# Patient Record
Sex: Female | Born: 1990 | Race: Black or African American | Hispanic: No | Smoking: Current every day smoker
Health system: Southern US, Community
[De-identification: ages and names within clinical notes are randomized; demographics above are authoritative.]

## PROBLEM LIST (undated history)

## (undated) ENCOUNTER — Inpatient Hospital Stay (HOSPITAL_COMMUNITY): Payer: Self-pay

## (undated) DIAGNOSIS — Z789 Other specified health status: Secondary | ICD-10-CM

## (undated) DIAGNOSIS — Z3480 Encounter for supervision of other normal pregnancy, unspecified trimester: Secondary | ICD-10-CM

## (undated) HISTORY — PX: NO PAST SURGERIES: SHX2092

## (undated) HISTORY — DX: Encounter for supervision of other normal pregnancy, unspecified trimester: Z34.80

## (undated) HISTORY — DX: Other specified health status: Z78.9

---
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
## 2009-03-12 ENCOUNTER — Emergency Department (HOSPITAL_COMMUNITY): Admission: EM | Admit: 2009-03-12 | Discharge: 2009-03-12 | Payer: Self-pay | Admitting: Emergency Medicine

## 2009-07-21 ENCOUNTER — Other Ambulatory Visit: Admission: RE | Admit: 2009-07-21 | Discharge: 2009-07-21 | Payer: Self-pay | Admitting: Obstetrics and Gynecology

## 2009-09-29 ENCOUNTER — Inpatient Hospital Stay (HOSPITAL_COMMUNITY): Admission: AD | Admit: 2009-09-29 | Discharge: 2009-09-29 | Payer: Self-pay | Admitting: Family Medicine

## 2009-11-21 ENCOUNTER — Inpatient Hospital Stay (HOSPITAL_COMMUNITY): Admission: AD | Admit: 2009-11-21 | Discharge: 2009-11-21 | Payer: Self-pay | Admitting: Obstetrics & Gynecology

## 2010-01-01 ENCOUNTER — Inpatient Hospital Stay (HOSPITAL_COMMUNITY): Admission: AD | Admit: 2010-01-01 | Discharge: 2010-01-02 | Payer: Self-pay | Admitting: Obstetrics & Gynecology

## 2010-01-28 ENCOUNTER — Inpatient Hospital Stay (HOSPITAL_COMMUNITY)
Admission: AD | Admit: 2010-01-28 | Discharge: 2010-01-28 | Payer: Self-pay | Source: Home / Self Care | Attending: Obstetrics and Gynecology | Admitting: Obstetrics and Gynecology

## 2010-02-03 ENCOUNTER — Inpatient Hospital Stay (HOSPITAL_COMMUNITY)
Admission: AD | Admit: 2010-02-03 | Discharge: 2010-02-03 | Payer: Self-pay | Source: Home / Self Care | Attending: Obstetrics & Gynecology | Admitting: Obstetrics & Gynecology

## 2010-02-16 ENCOUNTER — Encounter: Payer: Self-pay | Admitting: Obstetrics & Gynecology

## 2010-02-16 ENCOUNTER — Inpatient Hospital Stay (HOSPITAL_COMMUNITY)
Admission: AD | Admit: 2010-02-16 | Discharge: 2010-02-18 | Payer: Self-pay | Source: Home / Self Care | Attending: Obstetrics & Gynecology | Admitting: Obstetrics & Gynecology

## 2010-04-27 LAB — DIFFERENTIAL
Basophils Absolute: 0 10*3/uL (ref 0.0–0.1)
Basophils Relative: 0 % (ref 0–1)
Eosinophils Absolute: 0.1 10*3/uL (ref 0.0–0.7)
Eosinophils Relative: 0 % (ref 0–5)
Monocytes Absolute: 1.8 10*3/uL — ABNORMAL HIGH (ref 0.1–1.0)
Monocytes Relative: 7 % (ref 3–12)
Neutrophils Relative %: 83 % — ABNORMAL HIGH (ref 43–77)

## 2010-04-27 LAB — CBC
HCT: 28.7 % — ABNORMAL LOW (ref 36.0–46.0)
HCT: 33.6 % — ABNORMAL LOW (ref 36.0–46.0)
Hemoglobin: 10 g/dL — ABNORMAL LOW (ref 12.0–15.0)
Hemoglobin: 7.5 g/dL — ABNORMAL LOW (ref 12.0–15.0)
Hemoglobin: 7.5 g/dL — ABNORMAL LOW (ref 12.0–15.0)
MCH: 29 pg (ref 26.0–34.0)
MCH: 29.2 pg (ref 26.0–34.0)
MCH: 29.2 pg (ref 26.0–34.0)
MCHC: 33.6 g/dL (ref 30.0–36.0)
MCHC: 33.9 g/dL (ref 30.0–36.0)
MCHC: 34.8 g/dL (ref 30.0–36.0)
MCV: 83.7 fL (ref 78.0–100.0)
MCV: 86 fL (ref 78.0–100.0)
MCV: 86.2 fL (ref 78.0–100.0)
Platelets: 348 10*3/uL (ref 150–400)
Platelets: 357 10*3/uL (ref 150–400)
RBC: 2.57 MIL/uL — ABNORMAL LOW (ref 3.87–5.11)
RBC: 3.9 MIL/uL (ref 3.87–5.11)
RDW: 14.1 % (ref 11.5–15.5)
WBC: 12.9 10*3/uL — ABNORMAL HIGH (ref 4.0–10.5)
WBC: 29.6 10*3/uL — ABNORMAL HIGH (ref 4.0–10.5)

## 2010-04-27 LAB — DIC (DISSEMINATED INTRAVASCULAR COAGULATION)PANEL
Fibrinogen: 558 mg/dL — ABNORMAL HIGH (ref 204–475)
Prothrombin Time: 13.6 seconds (ref 11.6–15.2)
aPTT: 28 seconds (ref 24–37)

## 2010-04-27 LAB — ABO/RH: ABO/RH(D): O POS

## 2010-04-27 LAB — TYPE AND SCREEN
ABO/RH(D): O POS
Antibody Screen: NEGATIVE

## 2010-04-28 LAB — URINALYSIS, ROUTINE W REFLEX MICROSCOPIC
Hgb urine dipstick: NEGATIVE
Ketones, ur: NEGATIVE mg/dL
Nitrite: NEGATIVE
Protein, ur: NEGATIVE mg/dL
Specific Gravity, Urine: 1.015 (ref 1.005–1.030)
Urobilinogen, UA: 0.2 mg/dL (ref 0.0–1.0)
pH: 6.5 (ref 5.0–8.0)

## 2010-04-30 LAB — URINALYSIS, ROUTINE W REFLEX MICROSCOPIC
Hgb urine dipstick: NEGATIVE
Nitrite: NEGATIVE

## 2010-04-30 LAB — WET PREP, GENITAL: Trich, Wet Prep: NONE SEEN

## 2010-04-30 LAB — GC/CHLAMYDIA PROBE AMP, GENITAL: GC Probe Amp, Genital: NEGATIVE

## 2010-05-01 LAB — URINALYSIS, ROUTINE W REFLEX MICROSCOPIC
Bilirubin Urine: NEGATIVE
Glucose, UA: NEGATIVE mg/dL
Ketones, ur: >80 mg/dL — AB
Protein, ur: NEGATIVE mg/dL
Urobilinogen, UA: 0.2 mg/dL (ref 0.0–1.0)
pH: 8 (ref 5.0–8.0)

## 2010-05-01 LAB — DIFFERENTIAL
Eosinophils Absolute: 0.2 10*3/uL (ref 0.0–0.7)
Lymphs Abs: 1.8 10*3/uL (ref 0.7–4.0)
Monocytes Absolute: 0.6 10*3/uL (ref 0.1–1.0)
Neutrophils Relative %: 68 % (ref 43–77)

## 2010-05-01 LAB — COMPREHENSIVE METABOLIC PANEL
Calcium: 9.1 mg/dL (ref 8.4–10.5)
Chloride: 108 mEq/L (ref 96–112)
GFR calc Af Amer: >60 mL/min (ref >60)
GFR calc non Af Amer: >60 mL/min (ref >60)
Glucose, Bld: 72 mg/dL (ref 70–99)
Potassium: 3.5 mEq/L (ref 3.5–5.1)
Sodium: 134 mEq/L — ABNORMAL LOW (ref 135–145)

## 2010-05-01 LAB — CBC
HCT: 31.9 % — ABNORMAL LOW (ref 36.0–46.0)
Hemoglobin: 10.7 g/dL — ABNORMAL LOW (ref 12.0–15.0)
MCH: 29.6 pg (ref 26.0–34.0)
MCV: 88.5 fL (ref 78.0–100.0)
RDW: 13.3 % (ref 11.5–15.5)
WBC: 8.1 10*3/uL (ref 4.0–10.5)

## 2010-05-01 LAB — WET PREP, GENITAL: Clue Cells Wet Prep HPF POC: NONE SEEN

## 2010-05-01 LAB — GC/CHLAMYDIA PROBE AMP, GENITAL
Chlamydia, DNA Probe: NEGATIVE
GC Probe Amp, Genital: NEGATIVE

## 2010-05-03 LAB — URINALYSIS, ROUTINE W REFLEX MICROSCOPIC
Leukocytes, UA: NEGATIVE
Nitrite: NEGATIVE
Specific Gravity, Urine: 1.01 (ref 1.005–1.030)
pH: 6 (ref 5.0–8.0)

## 2010-05-03 LAB — WET PREP, GENITAL

## 2010-05-03 LAB — URINE MICROSCOPIC-ADD ON

## 2010-05-03 LAB — PREGNANCY, URINE: Preg Test, Ur: NEGATIVE

## 2010-07-03 NOTE — Group Therapy Note (Signed)
NAMEJAVARIA, KNAPKE               ACCOUNT NO.:  [id number]   MEDICAL RECORD NO.:  [id number]          PATIENT TYPE:  OIB   LOCATION:  A415                          FACILITY:  [facility]   PHYSICIAN:  Tilda Burrow, M.D. DATE OF BIRTH:  08/03/1990   DATE OF PROCEDURE:  DATE OF DISCHARGE:  12/02/2005                                   PROGRESS NOTE   Chrisanne came in about 4:00 a.m. with complaints of contractions.  She is  about [redacted] weeks pregnant with her first baby.  Her cervix is unchanged from  previous exams, which is basically one very thick -2 station.  She was  having some mild contractions.  She responded very well to subcu terbutaline  and was converted to p.o. after just one dose.  She is stable this morning.  Does not have any contractions for four hours.  Fetal heart rate is within  normal limits.  She is being discharged home with a prescription called in  to [facility] for Brethine 5 mg p.o. q.6h.  We discussed taking that  scheduled for few days and then maybe backing off on it a little bit if she  continues to not have any contractions.  Additionally, if she has  breakthrough contractions, she was instructed to come back to the hospital  for an injection.      [name], C.N.M.      Tilda Burrow, M.D.  Electronically Signed    FC/MEDQ  D:  12/02/2005  T:  12/03/2005  Job:  [id number]

## 2010-07-03 NOTE — Group Therapy Note (Signed)
Jamie Hendricks, Jamie Hendricks NO.:  [id number]   MEDICAL RECORD NO.:  [id number]          PATIENT TYPE:  OIB   LOCATION:  LDR2                          FACILITY:  [facility]   PHYSICIAN:  Richardean Canal, M.D.  DATE OF BIRTH:  06/27/1990   DATE OF PROCEDURE:  01/29/2006  DATE OF DISCHARGE:  01/29/2006                                 PROGRESS NOTE   HISTORY:  This 20 year old gravida called the answering service stating  that she had noted blood in the urine.  The patient was directed to come  to the birthing center.  The patient was placed on the monitor where she  was noted to have a reactive NST with no decelerations.  There were no  contractions.  An in and out catheterization was done and urine sent for  urinalysis, which was negative.  No blood was detected and there was no  vaginal bleeding noted during this observation admission.  The patient  was discharged home, reassured that she had no bleeding.           ______________________________  Richardean Canal, M.D.     RW/MEDQ  D:  01/30/2006  T:  01/30/2006  Job:  [id number]

## 2010-07-03 NOTE — H&P (Signed)
[name], [name]               ACCOUNT NO.:  [id number]   MEDICAL RECORD NO.:  [id number]          PATIENT TYPE:  OIB   LOCATION:  LDR3                          FACILITY:  [facility]   PHYSICIAN:  Tilda Burrow, M.D. DATE OF BIRTH:  05/03/1990   DATE OF ADMISSION:  12/29/2005  DATE OF DISCHARGE:  LH                                HISTORY & PHYSICAL   REASON FOR OBSERVATION:  The patient is at approximately [redacted] weeks pregnant  with decreased fetal movement and uterine irritability.   HISTORY OF PRESENT ILLNESS:  Jamie Hendricks said she woke up at about 11 o'clock  and has noticed since that time a decreased fetal movement.  She presents to  the hospital for irregular contractions and decreased fetal movement.   PAST MEDICAL HISTORY:  Positive for chlamydia.   PAST SURGICAL HISTORY:  Negative.  She does have a tongue-piercing allergy.   ALLERGIES:  No known drug allergies.   SOCIAL HISTORY:  She is single. She lives with her dad and her step mom.   FAMILY HISTORY:  Positive for stroke.   PRENATAL COURSE:  Complicated by abnormal Pap, condyloma.  Preterm  contractions, low iron.  Blood type is O positive. Urine drug screen  positive for THC.  Rubella immune.  Hepatitis B surface antigen negative,  HIV nonreactive.  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  AFP normal.   PHYSICAL EXAMINATION:  VITAL SIGNS: Stable.  PELVIC: Cervix is 1; it has been 1 for several weeks now, 50% effaced.  Baby  is still high.  Fetal heart rate pattern is reactive with accelerations  noted.   PLAN:  We are going to discharge her home with reactive nonstress test, and  she is to see [place] on Tuesday or p.r.n. as needed.      [name], [name]      Tilda Burrow, M.D.  Electronically Signed    DL/MEDQ  D:  04/54/0981  T:  12/29/2005  Job:  [id number]

## 2010-07-03 NOTE — H&P (Signed)
Jamie Hendricks, Jamie Hendricks               ACCOUNT NO.:  [id number]   MEDICAL RECORD NO.:  [id number]          PATIENT TYPE:  OIB   LOCATION:  A415                          FACILITY:  [facility]   PHYSICIAN:  Tilda Burrow, M.D. DATE OF BIRTH:  Apr 03, 1990   DATE OF ADMISSION:  01/03/2006  DATE OF DISCHARGE:  LH                              HISTORY & PHYSICAL   REASON FOR ADMISSION:  Pregnancy at 34 weeks and 4 days with uterine  contractions q. 2 minutes during the course of yesterday and today.   HISTORY OF PRESENT ILLNESS:  Jamie Hendricks has been hospitalized numerous  times for preterm contractions and presents again today with same  symptoms.   MEDICAL HISTORY:  Positive for positive Chlamydia in the past.   SURGICAL HISTORY:  Negative.  She also has a tongue piercing.   ALLERGIES:  She has no known allergies.   FAMILY HISTORY:  Positive for stroke.   PHYSICAL EXAMINATION:  VITAL SIGNS:  Stable.  CERVIX:  1 cm, probably about 50% effaced, -2 station.  Contraction  pattern is about every 2 minutes.  They are mild to palpation.  They are  not changing her cervix.  Fetal heart rate pattern is stable with  accelerations noted.   PLAN:  We are going to give her some pain medication and Ambien for  therapeutic rest and if she proceeds on into labor, we will not tocolyze  at this stage.      [name], [name]      Tilda Burrow, M.D.  Electronically Signed    DL/MEDQ  D:  08/65/7846  T:  01/03/2006  Job:  [id number]   cc:   Lorin Picket A. Gerda Diss, MD  Fax: [phone]   Francoise Schaumann. Milford Cage DO, FAAP  Fax: [phone]

## 2010-07-03 NOTE — Group Therapy Note (Signed)
Jamie Hendricks, Jamie Hendricks               ACCOUNT NO.:  [id number]   MEDICAL RECORD NO.:  [id number]          PATIENT TYPE:  OIB   LOCATION:  A415                          FACILITY:  [facility]   PHYSICIAN:  Tilda Burrow, M.D. DATE OF BIRTH:  05/01/1990   DATE OF PROCEDURE:  DATE OF DISCHARGE:  01/11/2006                                 PROGRESS NOTE   [name] is now 35 weeks and some change gestation.  She came in with  her usual complaint of contractions.  Her cervix is still unchanged at 2  /  / -2.  She is having contractions every few minutes but is just  crying and obviously in what she considers to be a lot of pain.  She was  given 10 mg of Nubain and 12-1/2 mg of Phenergan IM, which did settle  down her uterus and thus her pain.  She was discharged home in stable  condition.      [name], C.N.M.      Tilda Burrow, M.D.  Electronically Signed    FC/MEDQ  D:  01/13/2006  T:  01/13/2006  Job:  [id number]

## 2010-07-03 NOTE — Consult Note (Signed)
Jamie Hendricks, Jamie Hendricks               ACCOUNT NO.:  [id number]   MEDICAL RECORD NO.:  [id number]          PATIENT TYPE:  OIB   LOCATION:  LDR1                          FACILITY:  [facility]   PHYSICIAN:  Tilda Burrow, M.D. DATE OF BIRTH:  22-Feb-1990   DATE OF CONSULTATION:  10/16/2005  DATE OF DISCHARGE:  10/16/2005                                   CONSULTATION   REASON FOR CONSULTATION:  Observation for light spotting.   HISTORY OF PRESENT ILLNESS:  This 20 year old female gravida 1, para 0, LMP  May 04, 2005, is observed after pregnancy course followed by our office  due to presenting with spotting.  She is admitted with light spotting.  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  She is also noted to  have anemia with hemoglobin dropped to 7.7 from her initial hemoglobin of  12.7, anemia was allegedly confirmed via venipuncture.  The records are not  currently available.  Last weeks she was seen and had made some progress.  She has seen Labor and Delivery.  There has been no trauma, gush of fluid,  and only light blood when she wipes.    Ultrasound was performed by [facility]  which shows an oblique  Singleton vertex female infant with generous  amniotic fluid, good fetal movement.  The placenta is anterior, reaches low  on the anterior placental edge.  There is no discernable previa based on  ultrasound.  We therefore did a digital exam which shows the cervix to be  closed, firm and short with no blood noted on a glove.   IMPRESSION:  Second trimester bleeding suspected due to stretching of low  uterine segment at the edge of the placenta (marginal sinus separation),  minimal.  As stated earlier, there is no blood at the placental edge visible  on ultrasound at this time.   PLAN:  Will simply advise the patient to avoid sexual activity, heavy  lifting, follow up as scheduled on September 10.   Addendum blood type O  positive confirmed by old records reviewed.  Will check CBC to see how the  hemoglobin is holding up.  The patient has recently been encouraged to  change to a new iron tablet.      Tilda Burrow, M.D.  Electronically Signed     JVF/MEDQ  D:  10/16/2005  T:  10/18/2005  Job:  [id number]

## 2010-07-03 NOTE — H&P (Signed)
NAMENASRA, COUNCE NO.:  [id number]   MEDICAL RECORD NO.:  [id number]          PATIENT TYPE:  OIB   LOCATION:  LDR3                          FACILITY:  [facility]   PHYSICIAN:  Tilda Burrow, M.D. DATE OF BIRTH:  [birth year]   DATE OF ADMISSION:  10/21/2005  DATE OF DISCHARGE:  LH                                HISTORY & PHYSICAL   REASON FOR ADMISSION:  Pregnancy at 24 weeks with moderate to severe  abdominal pain centering around the umbilicus since midnight.   PAST MEDICAL HISTORY:  Positive for condyloma.   PAST SURGICAL HISTORY:  Negative.   FAMILY HISTORY:  Positive for stroke.   PRENATAL COURSE:  Uneventful up until this point.  She did have a colpo for  abnormal Pap, __Notable 1st Trimester lab was a positive UDS with positive  THC.  Rubella immune, hepatitis B surface antigen negative.  HIV is  nonreactive.  HPV is positive.  Serology is nonreactive.  Pap is LSIL.  GC  and chlamydia were negative.  AFP is normal.   PHYSICAL EXAMINATION:  VITAL SIGNS:  Stable.  ABDOMEN:  Positive for rebound tenderness.  Abdomen is gravid.  Fetal heart  rate pattern is stable with no uterine contractions noted.   PLAN:  We are going to get a CBC with diff and sed rate and ultrasound to  rule out appendix and assess fetal status.  The patient is n.p.o..  An IV  with lactated Ringer's at 125 and physician to assess.      [name], [name]      Tilda Burrow, M.D.  Electronically Signed    DL/MEDQ  D:  16/11/9602  T:  10/21/2005  Job:  [id number]

## 2010-07-03 NOTE — Consult Note (Signed)
Jamie Hendricks, RUPPEL               ACCOUNT NO.:  [id number]   MEDICAL RECORD NO.:  [id number]          PATIENT TYPE:  OIB   LOCATION:  LDR1                          FACILITY:  [facility]   PHYSICIAN:  Tilda Burrow, M.D. DATE OF BIRTH:  [birth year]   DATE OF CONSULTATION:  DATE OF DISCHARGE:  12/16/2005                                   CONSULTATION   OBSERVATION TIME:  8 hours.   CHIEF COMPLAINT:  Abdominal pain, stress, rule out pre-term labor.   HISTORY OF PRESENT ILLNESS:  This 20 year old primiparous female with a  history of recurrent visits to labor and delivery for stress and/or pre-term  labor complaints, as well as pelvic discomfort complaints is seen at 4:00  a.m., where she presents for abdominal  discomfort.  Patient is a poor  historian, but after lengthy efforts, it appears that the contractions are  both upper abdomen in the suprapubic area.  Urinalysis is obtained which  shows nitrates only but is negative for leukocyte esterase protein.  Urine  drug screen is negative.  Hemoglobin 9, hematocrit 27, white count 8,800.  Microscopic on the urinalysis is not necessary.  See Agustin Cree Lawson's  observation note from this a.m.   HOSPITAL COURSE:  After 8 hours, the patient is still moderately  uncomfortable, but there has been absolutely no contractions, and fetal  heart rate __________  wonderfully reactive.  Cervical exam shows no  changes.  The cervix is fingertip, long, posterior -2 with patient grimacing  noticeably with even minimal contact to the lower uterine segment, though  there is no guarding.   IMPRESSION:  No evidence of pre-term labor.  Physiologic discomforts of late  pregnancy, poor patient tolerance of discomfort.   PLAN:  Patient given Ambien prescription to assist her with sleep, and she  prefers this to consideration of pain medications.  Will not continue  Brethine, as patient is non-compliant anyway.      Tilda Burrow, M.D.  Electronically  Signed     JVF/MEDQ  D:  12/16/2005  T:  12/16/2005  Job:  [id number]

## 2010-07-03 NOTE — Group Therapy Note (Signed)
[name], [name] NO.:  [id number]   MEDICAL RECORD NO.:  [id number]          PATIENT TYPE:  OIB   LOCATION:  LDR3                          FACILITY:  [facility]   PHYSICIAN:  Tilda Burrow, M.D. DATE OF BIRTH:  06-11-1990   DATE OF PROCEDURE:  DATE OF DISCHARGE:  10/21/2005                                   PROGRESS NOTE   Jamie Hendricks received her ultrasound and blood work all which were within normal  limits, specifically white count and sed rate were normal.  An ultrasound  revealed a normal placenta with no previa or abruption identified.  Jamie Hendricks  never received anything for pain.  She still complains of an off and on dull-  type pain around her umbilicus.  Urinalysis was also within normal limits.  At this point, we cannot identify any troublesome source of her discomfort  so she was sent home in stable condition.      [name], C.N.M.      Tilda Burrow, M.D.  Electronically Signed    FC/MEDQ  D:  10/21/2005  T:  10/21/2005  Job:  [id number]

## 2010-07-03 NOTE — H&P (Signed)
[name], Hendricks               ACCOUNT NO.:  [id number]   MEDICAL RECORD NO.:  [id number]          PATIENT TYPE:  OIB   LOCATION:  LDR2                          FACILITY:  [facility]   PHYSICIAN:  Tilda Burrow, M.D. DATE OF BIRTH:  May 21, 1990   DATE OF ADMISSION:  01/23/2006  DATE OF DISCHARGE:  LH                              HISTORY & PHYSICAL   OBSERVATION ADMISSION:   REASON FOR ADMISSION:  Preeclampsia at 37 weeks 3 days with abdominal  pain.   HISTORY OF PRESENT ILLNESS:  Jamie Hendricks is a 20 year old gravida 1, para  0, EDC is December 27, who presents with mild irregular irritable-type  contraction pattern.  She was medicated with p.o. Lortab during the  night and rested from that.  Cervix this morning remains unchanged.  Fetal heart rate pattern is stable with reactivity noted on the fetal  monitoring strip.  Cervix, again, is unchanged.  She is having some mild  irritable contractions.   PLAN:  We are going to discharge her home on some Lortab 10/500 mg one  p.o. q.4h. p.r.n. pain, and 10 mg of Nubain and 12.5 mg of Phenergan IM.  She knows to come back if she has regular uterine contractions or if her  water breaks.      [name], [name]      Tilda Burrow, M.D.  Electronically Signed    DL/MEDQ  D:  04/54/0981  T:  01/24/2006  Job:  [id number]

## 2010-07-03 NOTE — Consult Note (Signed)
NAMEKAYDE, ATKERSON               ACCOUNT NO.:  [id number]   MEDICAL RECORD NO.:  [id number]          PATIENT TYPE:  OIB   LOCATION:  A415                          FACILITY:  [facility]   PHYSICIAN:  Tilda Burrow, M.D. DATE OF BIRTH:  September 03, 1990   DATE OF CONSULTATION:  DATE OF DISCHARGE:                                   CONSULTATION   OBSERVATION OVERNIGHT   SUMMARY:  This 20 year old gravida 1, para 0, EDC December 27 at 32+ weeks  gestation presented, again, to hospital complaining of abdominal discomfort  and occasional contractions.  External monitoring shows occasional mild  contractions.  She has not been taking her Brethine at home.  She was  afebrile.  She complained of increased vaginal liquid. Speculum exam shows  no evidence of membrane rupture.  She is Nitrazine negative and physiologic  secretions are apparent. Cervix is closed to cervical exam, posterior, firm  and high, unchanged from last visit.   HOSPITAL COURSE:  The patient received single dose terbutaline and was found  to be afebrile without any problems with the baby or the mother.  She had  abdominal discomfort which seemed to be reproduced by contact with lower  uterine segment.  Vertex presenting part is ballotable.   Overnight she had no further contractions and felt slightly better, though  she always looks extremely uncomfortable even when she is denying  contractions.  Because of her noncompliance with medicines she was given  beclomethasone 12 mg IM on 12/20/2005 and told to report to our office on  12/21/2005 for betamethasone second injection.  She will be followed  routinely with Brethine 5 mg p.o. q.6 h. times two more weeks.      Tilda Burrow, M.D.  Electronically Signed     JVF/MEDQ  D:  12/21/2005  T:  12/22/2005  Job:  [id number]   cc:   [facility]

## 2010-07-03 NOTE — H&P (Signed)
Jamie, Hendricks               ACCOUNT NO.:  [id number]   MEDICAL RECORD NO.:  [id number]          PATIENT TYPE:  OIB   LOCATION:  LDR1                          FACILITY:  [facility]   PHYSICIAN:  Tilda Burrow, M.D. DATE OF BIRTH:  08-14-1990   DATE OF ADMISSION:  12/16/2005  DATE OF DISCHARGE:  LH                                HISTORY & PHYSICAL   OBSERVATION ADMISSION HISTORY AND PHYSICAL:  Date of admission December 16, 2005, at 4:45 a.m.   HISTORY OF PRESENT ILLNESS:  Jamie Hendricks is a 20 year old gravida 1, para 0,  EDC December 27, at 32 weeks, admitted with preterm uterine contractions.   Medical history is positive for Chlamydia.  Surgical history is negative.   ALLERGIES:  She has no known allergies.   FAMILY HISTORY:  Negative.   Prenatal course essentially uneventful except with anemia.  The patient was  placed on __________ for that and Chromagen and Repliva.  Blood type is O  positive.  UDS positive for THC.  Rubella is immune.  Hepatitis B surface  antigen negative.  HIV is negative.  HPV is positive.  Serology is  nonreactive.  Pap LAL with positive HPV.  GC and Chlamydia are negative OP.  AFP is normal.   PHYSICAL EXAMINATION:  VITAL SIGNS:  Stable.  Fetal heart rate pattern is  stable, but she is having regular uterine contractions about every 4  minutes, moderate intensity, and the patient is very uncomfortable with  these.  PELVIC:  Cervix is closed, posterior, firm and high.   PLAN:  We are going to admit, IV hydration, and preterm labor protocol with  terbutaline.  Get a CBC and a UA and observe.      [name], [name]      Tilda Burrow, M.D.  Electronically Signed   DL/MEDQ  D:  78/29/5621  T:  12/16/2005  Job:  [id number]   cc:   [facility]

## 2010-07-03 NOTE — Op Note (Signed)
Jamie Hendricks, Jamie Hendricks               ACCOUNT NO.:  [id number]   MEDICAL RECORD NO.:  [id number]          PATIENT TYPE:  INP   LOCATION:  A402                          FACILITY:  [facility]   PHYSICIAN:  Tilda Burrow, M.D. DATE OF BIRTH:  07-31-1990   DATE OF PROCEDURE:  02/07/2006  DATE OF DISCHARGE:                               OPERATIVE REPORT   DELIVERY NOTE:  Jamie Hendricks progressed nicely after the epidural was in place.  She had  excellent analgesic effect.  She reached completely dilated, and after a  brief second stage, reached crowning position just before 3 p.m.  Delivery was at approximately 1440 p.m., delivering a healthy infant  with Apgars of 9 and 9.   Postpartum course was notable for rapid delivery of the placenta, with  almost no bleeding initially.  She then had uterine atony, and despite  repeated uterine massage, would not have good progress.  She ended up  requiring extensive uterine massage in repeated efforts to control the  bleeding, including lengthy fundal pressure combined with Hemabate 250  mcg IM and subsequent IV oxytocin.  Inspection of the placenta showed no  evidence of bleeding.  She lost at least 1000 mL and handled it with no  evidence of hypotension.  After approximately 20 minutes of work, the  uterus remained firm.      Tilda Burrow, M.D.  Electronically Signed     JVF/MEDQ  D:  02/08/2006  T:  02/08/2006  Job:  [id number]   cc:   [facility]

## 2010-07-03 NOTE — Consult Note (Signed)
Jamie Hendricks, Jamie Hendricks               ACCOUNT NO.:  [id number]   MEDICAL RECORD NO.:  [id number]          PATIENT TYPE:  OBV   LOCATION:  A415                          FACILITY:  [facility]   PHYSICIAN:  Tilda Burrow, M.D. DATE OF BIRTH:  Jul 29, 1990   DATE OF CONSULTATION:  DATE OF DISCHARGE:  12/22/2005                                   CONSULTATION   OBSERVATION TIME:  8 hours.   OBSERVATION SUMMARY:  This 20 year old female at 32 weeks 6 days is seen for  her recurrence lower abdominal discomfort and mild preterm labor symptoms.  She has been followed through our office with multiple recurrent visits over  the past couple of weeks.  She has been treated with terbutaline.  She has  received antibiotic regimen as precaution, and she has received  betamethasone 12 mg IM q.24 h earlier this week.  She is afebrile.  Presents  two hours after her last oral terbutaline dose 5 mg p.o. q.6 h., complaining  of her heart racing, mild headache and some lower abdominal discomfort,  which on external monitoring, it shows mild contractions every 10 minutes  approximately, easing off while on bed rest without any other interventions.  Overnight, the patient had resolution of her contractions, did not require  additional therapy and was not even given an additional terbutaline dose.  Cervical exam on admission shows the cervix to be unchanged, closed long, in  position at last exam.  Previously, the cervix was firm texture and the  presenting part is ballotable, within a well developed low uterine segment.  Contact to lower uterine segment is significantly uncomfortable.  The  patient reproduces her perceived lower abdominal pain.  There is no uterine  irritability.   IMPRESSION:  1. Mild preterm labor symptoms.  2. Physiological discomforts of pregnancy, tolerated poorly by patient.  3. No evidence of membrane rupture.   PLAN:  1. Continue Brethine 5 mg p.o. q.6 h.  Patient encouraged to  consider      improving her compliance.  2. Follow up tomorrow morning for a fetal fibronectin in the office      appointment 9:00 a.m., December 23, 2005.  (FFN results = NEGATIVE)      Tilda Burrow, M.D.  Electronically Signed     JVF/MEDQ  D:  12/22/2005  T:  12/22/2005  Job:  [id number]

## 2010-07-03 NOTE — Group Therapy Note (Signed)
NAMEMAYDA, SHIPPEE NO.:  [id number]   MEDICAL RECORD NO.:  [id number]          PATIENT TYPE:  OIB   LOCATION:  LDR3                          FACILITY:  [facility]   PHYSICIAN:  Tilda Burrow, M.D. DATE OF BIRTH:  01-07-1991   DATE OF PROCEDURE:  DATE OF DISCHARGE:  10/15/2005                                   PROGRESS NOTE   Caterina is about 49 weeks' gestation who presented to labor and delivery  with complaints of some vaginal spotting and some lower back pain.  A  sterile speculum exam was performed and there was absolutely no blood in the  vaginal vault.  Her cervix was long, thick, closed and firm.  There were no  signs of infection.  There was a very scant amount of dark red blood on her  panties that she showed me.  The toco revealed a little bit of irritability.  She had 2+ ketones in her urine.  She received 2 liters of IV fluids, and  12.5 mg of Phenergan because she has been nauseated all day with a headache.  Blood pressure was within normal limits.  She was discharged home in stable  condition.   IMPRESSION:  1. Intrauterine pregnancy at 23 weeks.  2. Uterine irritability secondary to dehydration.  3. Vaginal spotting of unknown origin.   She is to follow up with our office if she has any more bleeding.      [name], C.N.M.      Tilda Burrow, M.D.  Electronically Signed    FC/MEDQ  D:  10/15/2005  T:  10/15/2005  Job:  [id number]   cc:   [facility]

## 2010-07-03 NOTE — H&P (Signed)
NAMESHACORA, ZYNDA               ACCOUNT NO.:  [id number]   MEDICAL RECORD NO.:  [id number]          PATIENT TYPE:  OIB   LOCATION:  LDR3                          FACILITY:  [facility]   PHYSICIAN:  Tilda Burrow, M.D. DATE OF BIRTH:  [birth year]   DATE OF ADMISSION:  11/10/2005  DATE OF DISCHARGE:  LH                                HISTORY & PHYSICAL   REASON FOR ADMISSION:  Pregnancy at 26 weeks and 6 days with pre-term labor.   HISTORY OF PRESENT ILLNESS:  Avelyn is a 20 year old black female, gravida  1, para 0, EDC of December 27, who presents to the office in moderate to  severe discomfort, states she started having pains about 6:45 this morning  and has progressively gotten worse.   PAST MEDICAL HISTORY:  Positive for chlamydia.   PAST SURGICAL HISTORY:  Negative.   ALLERGIES:  NO KNOWN DRUG ALLERGIES.   PRENATAL COURSE:  Uneventful up until this point.  She had an abnormal Pap  and she has a low hemoglobin of 7.7.  Blood type is O positive.  UDS is  positive for THC.  Rubella is immune.  Hepatitis B surface antigen negative.  HIV is negative.  HPV is negative.  Serology is nonreactive.  Pap is  abnormal.  GC and chlamydia are negative.  AFP within normal limits.   PHYSICAL EXAMINATION:  Blood pressure 90/60.  Weight 121 pounds.  Fetal  heart rate is 150, strong and regular.  Uterus palpates soft.  There are  contractions noted with palpation.  Cervix is fingertip soft, anterior and  still fairly long.  There is a bulge in the lower uterine segment.  I  obtained a fetal fibronectin prior to exam.  A GBS, GC and chlamydia is sent  for examination.  We are going to admit, monitor, IV tocolytics, IV  antibiotics and consult Dr. Emelda Fear and Dr. Despina Hidden.      [name], [name]      Tilda Burrow, M.D.  Electronically Signed   DL/MEDQ  D:  04/54/0981  T:  11/10/2005  Job:  [id number]   cc:   [facility]

## 2010-07-03 NOTE — Consult Note (Signed)
Jamie Hendricks, Jamie Hendricks               ACCOUNT NO.:  [id number]   MEDICAL RECORD NO.:  [id number]          PATIENT TYPE:  OIB   LOCATION:  LDR2                          FACILITY:  [facility]   PHYSICIAN:  Lazaro Arms, M.D.   DATE OF BIRTH:  08/09/1990   DATE OF CONSULTATION:  01/09/2006  DATE OF DISCHARGE:  01/09/2006                                   CONSULTATION   LABOR & DELIVERY OBSERVATION NOTE:  Jamie Hendricks is a 20 year old gravida 1 at  35-2/[redacted] weeks gestation who has had multiple visits to the hospital for  complaints of contractions in labor.  She has been fetal fibronectin  negative.  She has received betamethasone.  She called last night saying she  was having contractions every 2-3 minutes.  When she got to the hospital  surface was about the same as it had been to pretty thick and high, buttery  soft.  She was given some Phenergan and Nubain x2 and that really quieted  everything down.  She has been sleeping the past few hours.  Her urinalysis  is completely negative.  Fetal heart rate tracing is reactive.  She has had  no bleeding, no rupture of membranes.  After 16 hours of being in Labor &  Delivery, she has had no cervical change at all.  Her contractions are very  sporadic.  Since she is 35 weeks I am certainly not going to do anything to  cause labor, I am not going to stop it either, but she is certainly not in  labor at this point.  As a result, she is discharged to home and she will  keep her scheduled appointment later this week.      Lazaro Arms, M.D.  Electronically Signed     LHE/MEDQ  D:  01/09/2006  T:  01/09/2006  Job:  [id number]

## 2010-07-03 NOTE — Discharge Summary (Signed)
Jamie Hendricks, Jamie Hendricks               ACCOUNT NO.:  [id number]   MEDICAL RECORD NO.:  [id number]          PATIENT TYPE:  INP   LOCATION:  LDR3                          FACILITY:  [facility]   PHYSICIAN:  Tilda Burrow, M.D. DATE OF BIRTH:  [birth year]   DATE OF ADMISSION:  11/10/2005  DATE OF DISCHARGE:  09/29/2007LH                                 DISCHARGE SUMMARY   ADMITTING DIAGNOSES:  1. Pregnancy at 26 weeks 6 days.  2. Preterm labor.   DISCHARGE DIAGNOSES:  1. Pregnancy, 27 weeks 2 days, not delivered.  2. Preterm labor, resolved.   PROCEDURES:  Fetal fibronectin on November 10, 2005.===Negative   HOSPITAL SUMMARY:  This 20 year old female gravida 1, para 0 seen in the  office multiple times for discomforts of pregnancy with no abnormalities  found to date.  The patient was admitted on November 10, 2005, with cervix  closed, soft, anterior, still fairly long, with bulging lower uterine  segment.  Fetal fibronectin was obtained.  GBS, GC and chlamydia cultures  were also obtained.   The patient was admitted; received terbutaline protocol subcutaneous _ x1  dose__ and also received BETAMETHASONE.   Fetal fibronectin was also obtained and was subsequently negative.  Patient  was discharged home finally on November 13, 2005, in stable condition for  followup through our office.      Tilda Burrow, M.D.  Electronically Signed     JVF/MEDQ  D:  12/27/2005  T:  12/27/2005  Job:  [id number]

## 2010-07-03 NOTE — Group Therapy Note (Signed)
NAMEIMMACULATE, CRUTCHER NO.:  [id number]   MEDICAL RECORD NO.:  [id number]          PATIENT TYPE:  OBV   LOCATION:  LDR1                          FACILITY:  [facility]   PHYSICIAN:  Tilda Burrow, M.D. DATE OF BIRTH:  08/29/1990   DATE OF PROCEDURE:  DATE OF DISCHARGE:  01/27/2006                                 PROGRESS NOTE   Jamie Hendricks is now [redacted] weeks gestation. Came in with complaints of regular  uterine contractions. Sure enough she is having contractions about every  5-7 minutes as she has done for about the past month. Her cervix is  still 1-2, 50% soft, vertex presentation. She was observed overnight and  has not made any cervical change. The fetal heart rate is reactive so we  will discharge her home in stable condition.      [name], C.N.M.      Tilda Burrow, M.D.  Electronically Signed    FC/MEDQ  D:  01/27/2006  T:  01/27/2006  Job:  [id number]

## 2010-07-03 NOTE — Group Therapy Note (Signed)
[name], Jamie Hendricks NO.:  [id number]   MEDICAL RECORD NO.:  [id number]          PATIENT TYPE:  OIB   LOCATION:  LDR1                          FACILITY:  [facility]   PHYSICIAN:  Tilda Burrow, M.D. DATE OF BIRTH:  [birth year]   DATE OF PROCEDURE:  DATE OF DISCHARGE:                                 PROGRESS NOTE   [name] is now 38 weeks 6 days gestation, came in last night with  complaints of contractions as per usual. Also as per usual her cervix is  not responding to these mild contractions. She was given an IV,  hydration, and some IV pain medicine which slowed them down. She is  still contracting mildly and irregularly. Her cervix is very soft, 1  almost 2 cm, 50% effaced, -1 station. Fetal heart rate is reactive. She  was observed overnight without any cervical change so she is being sent  home. She is to return to labor and delivery if her contractions get  stronger or her water breaks or if she has heavy vaginal bleeding.      [name], C.N.M.      Tilda Burrow, M.D.  Electronically Signed    FC/MEDQ  D:  02/03/2006  T:  02/03/2006  Job:  [id number]

## 2010-07-03 NOTE — Discharge Summary (Signed)
Jamie Hendricks, Jamie Hendricks               ACCOUNT NO.:  [id number]   MEDICAL RECORD NO.:  [id number]          PATIENT TYPE:  OIB   LOCATION:  A415                          FACILITY:  [facility]   PHYSICIAN:  Tilda Burrow, M.D. DATE OF BIRTH:  [birth year]   DATE OF ADMISSION:  12/19/2005  DATE OF DISCHARGE:  11/05/2007LH                                 DISCHARGE SUMMARY   CHIEF COMPLAINT:  1. Observation x13 hours, pregnancy 32 weeks recurrent preterm labor      symptoms, mild.  2. Noncompliance with terbutaline oral therapy.  3. No evidence of urinary tract infection.   HOSPITAL SUMMARY:  This 20 year old primiparous female followed through our  office for pregnancy care with [facility] 02/10/06 based on menstrual and  ultrasound criteria.  She is 32 weeks, 4 days on 12/20/05.  Prenatal course  had been notable for significant anemia with the patient having hemoglobin  initially at 12.7, documented as low as 10.7 on veno puncture.  She presents  on 12/19/05 complaining of 3 days of discomfort.  Presents almost tearful  with the discomfort though the uterus is not relaxes, has occasional mild  contraction every 6-10 minutes of brief duration with the cervix which is  unchanged from last visit.  She is given single dose of terbutaline  subcutaneous and then oral therapy with resolution of contractions.  Additionally she has assessment which includes urinalysis which was  negative, urine drug screen which is negative.  CBC white count 10,200,  hemoglobin 99, hematocrit 29.0. She is kept overnight with resolution of all  contractions and a normal heart rate but she still complains of lower  abdominal discomfort.  There is no right upper quadrant pain.  Cervical exam  shows the cervix to be unchanged.  She does have moderate discomfort with  contact of the lower uterine segment, which seems to be consistent with her  overall discomfort complaints.   PLAN:  To go ahead and give her betamethasone 12 mg IM  today, repeat in a.m.  in office, with q.1-2 week visits during early third trimester.  She is  given prescription for Reglan 10 mg AC to reduce nausea and prescription re-  written for terbutaline  5 mg p.o. every 6 hours.      Tilda Burrow, M.D.  Electronically Signed     JVF/MEDQ  D:  12/20/2005  T:  12/20/2005  Job:  [id number]   cc:   [facility]

## 2010-07-03 NOTE — H&P (Signed)
Jamie Hendricks, Jamie Hendricks               ACCOUNT NO.:  [id number]   MEDICAL RECORD NO.:  [id number]          PATIENT TYPE:  INP   LOCATION:  LDR2                          FACILITY:  [facility]   PHYSICIAN:  Tilda Burrow, M.D. DATE OF BIRTH:  01/27/1991   DATE OF ADMISSION:  02/06/2006  DATE OF DISCHARGE:  LH                              HISTORY & PHYSICAL   ADMITTING DIAGNOSES:  1. Pregnancy 39-1/[redacted] weeks gestation.  2. Prodromal labor symptoms.  3. Elective induction of labor.   HPI:  This 20 year old female gravida 1, para 0, due February 10, 2006,  by menstrual criteria, with first and second trimester ultrasounds  corresponding, is admitted for induction of labor.  She came in at mid  day on the 23rd after a second conversation this weekend with her  assessing her discomfort.  She complains of regular contractions every 3-  4 minutes throughout the weekend.  Cervix is indeed anterior, soft, 1-2  cm but the vertex is at the pelvic inlet.  Membranes are intact.  Fetal  heart rate tracing is normal.  We placed Foley bulb for cervical  ripening over the afternoon hours and left it in for 4-5 hours.  Cervix  indeed 4 cm long, -2 to -3 station anterior and soft.  Vertex remains at  the pelvic inlet.  Plans are for admission over night stay in the  hospital, Pitocin induction to begin at 4 a.m. with uncertain prognosis  for vaginal delivery due to concern over the station and ability of the  vertex to descend through the rather narrow pelvic diameters.   PAST MEDICAL HISTORY:  1. Positive for Chlamydia, treated, and subsequently tested as      negative.  2. Group B Strep is negative.  3. GC and Chlamydia were negative at 34 weeks.   SURGICAL HISTORY:  Negative.   She has a tongue piercing.   ALLERGIES:  NONE.   CIGARETTES:  One cigarette per day.   ALCOHOL RECREATIONAL DRUGS:  None.   She states she is single, lives with her Dad, high school graduate.   Pregnancy course has been  notable for significant anemia at one time  suspected, rechecked and felt perhaps to be lab error as admitting labs  today show a hemoglobin 11.1, hematocrit 32.9, blood type is O positive.   PHYSICAL EXAM:  Reveals a somber, tiny-framed, African-American female.  Height 5 feet, weight 137, which is a 22 pound weight gain from her  initial weight of 115.  Pupils equal, round, reactive.  NECK:  Supple.  Alert, oriented.  ABDOMEN:  Term sized fetus, 36 cm fundal height, estimated fetal weight  7 pounds.  CERVIX:  Is 4 cm, uneffaced, -2 to -3 vertex, membranes intact at 7 p.m.   PLAN:  1. Due to cervical manipulation with the Foley earlier today, will      begin Ancef as prophylaxis.  2. Will begin Pitocin at 4 a.m, epidural shortly thereafter.   Uncertain prognosis for vaginal delivery.      Tilda Burrow, M.D.  Electronically Signed     JVF/MEDQ  D:  02/06/2006  T:  02/07/2006  Job:  [id number]

## 2010-07-03 NOTE — Group Therapy Note (Signed)
Jamie Hendricks, Jamie Hendricks               ACCOUNT NO.:  [id number]   MEDICAL RECORD NO.:  [id number]          PATIENT TYPE:  OIB   LOCATION:  A415                          FACILITY:  [facility]   PHYSICIAN:  Tilda Burrow, M.D. DATE OF BIRTH:  15-Feb-1991   DATE OF PROCEDURE:  01/04/2006  DATE OF DISCHARGE:                                 PROGRESS NOTE   During the course of the night, Joeline has slept well with pain  medicine. She is awake this morning, alert, oriented times 3, having  breakfast. No uterine contractions are being traced.   PLAN:  We are going to discharge her home in the care of her parents.      [name], [name]      Tilda Burrow, M.D.  Electronically Signed    DL/MEDQ  D:  16/11/9602  T:  01/04/2006  Job:  [id number]   cc:   __________

## 2010-07-03 NOTE — Op Note (Signed)
[name], [name]               ACCOUNT NO.:  [id number]   MEDICAL RECORD NO.:  [id number]          PATIENT TYPE:  INP   LOCATION:  A402                          FACILITY:  [facility]   PHYSICIAN:  Tilda Burrow, M.D. DATE OF BIRTH:  [birth year]   DATE OF PROCEDURE:  02/07/2006  DATE OF DISCHARGE:                               OPERATIVE REPORT   PROCEDURE:  Epidural catheter placement.   [name] requested epidural at __________ a.m.  The patient was prepped  and draped in sitting position, flexed forward, with loss of resistance  technique used with Tuohy needle to identify the epidural space at  approximately a 4 to 5 cm skin depth.  This was achieved on the first  attempt without significant difficulty.  The epidural catheter was  inserted 3 cm into the epidural space.  An initial injection of 2 mL of  1.5% Xylocaine with epinephrine was injected prior to insertion of the  epidural catheter.  No tachycardia noted and no evidence of spinal  block.  The catheter was threaded 3 cm into the epidural space.  A 3 mL  test dose of 1.5% Xylocaine with epinephrine was infused followed by a  bolus of 9 mL of Marcaine solution with subsequent infusion of 14 mL per  hour.  The patient had symmetric analgesic effect.  Time of epidural was  approximately 1 p.m.      Tilda Burrow, M.D.  Electronically Signed     JVF/MEDQ  D:  02/08/2006  T:  02/08/2006  Job:  [id number]

## 2010-11-22 ENCOUNTER — Encounter: Payer: Self-pay | Admitting: *Deleted

## 2010-11-22 ENCOUNTER — Emergency Department (HOSPITAL_COMMUNITY)
Admission: EM | Admit: 2010-11-22 | Discharge: 2010-11-22 | Disposition: A | Discharge status: Home / Self Care | Payer: Self-pay | Attending: Emergency Medicine | Admitting: Emergency Medicine

## 2010-11-22 DIAGNOSIS — F17200 Nicotine dependence, unspecified, uncomplicated: Secondary | ICD-10-CM | POA: Insufficient documentation

## 2010-11-22 DIAGNOSIS — L02519 Cutaneous abscess of unspecified hand: Secondary | ICD-10-CM | POA: Insufficient documentation

## 2010-11-22 DIAGNOSIS — L02419 Cutaneous abscess of limb, unspecified: Secondary | ICD-10-CM

## 2010-11-22 MED ORDER — SULFAMETHOXAZOLE-TRIMETHOPRIM 800-160 MG PO TABS: 1.0000 | ORAL_TABLET | Freq: Two times a day (BID) | ORAL | Status: AC

## 2010-11-22 MED ORDER — HYDROCODONE-ACETAMINOPHEN 5-325 MG PO TABS: 1.0000 | ORAL_TABLET | ORAL | Status: AC | PRN

## 2010-11-22 NOTE — ED Provider Notes (Signed)
History     CSN: [id number] Arrival date & time: 11/22/2010  3:41 PM  Chief Complaint  Patient presents with  . Insect Bite    (Consider location/radiation/quality/duration/timing/severity/associated sxs/prior treatment) Patient is a 20 y.o. female presenting with abscess. The history is provided by the patient.  Abscess  This is a new problem. The current episode started less than one week ago. The problem occurs frequently. The problem has been rapidly improving. The abscess is present on the right wrist. The problem is moderate. The abscess is characterized by redness and painfulness. The patient was exposed to an insect bite/sting. Pertinent negatives include no anorexia, not sleeping less, no fever, no vomiting and no cough. There were no sick contacts.    History reviewed. No pertinent past medical history.  History reviewed. No pertinent past surgical history.  No family history on file.  History  Substance Use Topics  . Smoking status: Current Everyday Smoker  . Smokeless tobacco: Not on file  . Alcohol Use: Yes    OB History    Grav Para Term Preterm Abortions TAB SAB Ect Mult Living                  Review of Systems  Constitutional: Negative for fever and activity change.       All ROS Neg except as noted in HPI  HENT: Negative for nosebleeds and neck pain.   Eyes: Negative for photophobia and discharge.  Respiratory: Negative for cough, shortness of breath and wheezing.   Cardiovascular: Negative for chest pain and palpitations.  Gastrointestinal: Negative for vomiting, abdominal pain, blood in stool and anorexia.  Genitourinary: Negative for dysuria, frequency and hematuria.  Musculoskeletal: Negative for back pain and arthralgias.  Skin: Negative.   Neurological: Negative for dizziness, seizures and speech difficulty.  Psychiatric/Behavioral: Negative for hallucinations and confusion.    Allergies  Review of patient's allergies indicates no known  allergies.  Home Medications  No current outpatient prescriptions on file.  BP 109/58  Pulse 63  Temp(Src) 98.4 F (36.9 C) (Oral)  Resp 20  Ht 5' (1.524 m)  Wt 125 lb (56.7 kg)  BMI 24.41 kg/m2  SpO2 98%  LMP 11/08/2010  Physical Exam  Nursing note and vitals reviewed. Constitutional: She is oriented to person, place, and time. She appears well-developed and well-nourished.  Non-toxic appearance.  HENT:  Head: Normocephalic.  Right Ear: Tympanic membrane and external ear normal.  Left Ear: Tympanic membrane and external ear normal.  Eyes: EOM and lids are normal. Pupils are equal, round, and reactive to light.  Neck: Normal range of motion. Neck supple. Carotid bruit is not present.  Cardiovascular: Normal rate, regular rhythm, normal heart sounds, intact distal pulses and normal pulses.   Pulmonary/Chest: Breath sounds normal. No respiratory distress.  Abdominal: Soft. Bowel sounds are normal. There is no tenderness. There is no guarding.  Musculoskeletal: Normal range of motion.       Pt has a slightly raised red area of the right palmar surface of the wrist. The area is warm an painful. Pain with ROM of the wrist and fingers.  Lymphadenopathy:       Head (right side): No submandibular adenopathy present.       Head (left side): No submandibular adenopathy present.    She has no cervical adenopathy.  Neurological: She is alert and oriented to person, place, and time. She has normal strength. No cranial nerve deficit or sensory deficit.  Skin: Skin is warm and dry.  Psychiatric: She has a normal mood and affect. Her speech is normal.    ED Course: Plan an tx discussed with pt.  Procedures (including critical care time)  Labs Reviewed - No data to display No results found.   Dx: Abscess of the right wrist.   MDM  I have reviewed nursing notes, vital signs, and all appropriate lab and imaging results for this patient.        Kathie Dike, [place] 11/22/10  [phone]

## 2010-11-22 NOTE — ED Provider Notes (Signed)
Medical screening examination/treatment/procedure(s) were performed by non-physician practitioner and as supervising physician I was immediately available for consultation/collaboration.   Eland Lamantia L Korra Christine, MD 11/22/10 2235 

## 2010-11-22 NOTE — ED Notes (Signed)
Pt states that she thought she was bitten by mosquito on Friday to right inside of lower arm.  was able to squeeze some clear liquid out, area became better, then became swollen, painful, red,

## 2011-06-12 ENCOUNTER — Emergency Department (HOSPITAL_COMMUNITY)
Admission: EM | Admit: 2011-06-12 | Discharge: 2011-06-12 | Disposition: A | Discharge status: Home / Self Care | Payer: Self-pay | Attending: Emergency Medicine | Admitting: Emergency Medicine

## 2011-06-12 ENCOUNTER — Encounter (HOSPITAL_COMMUNITY): Payer: Self-pay | Admitting: *Deleted

## 2011-06-12 DIAGNOSIS — F17200 Nicotine dependence, unspecified, uncomplicated: Secondary | ICD-10-CM | POA: Insufficient documentation

## 2011-06-12 DIAGNOSIS — R064 Hyperventilation: Secondary | ICD-10-CM | POA: Insufficient documentation

## 2011-06-12 DIAGNOSIS — R55 Syncope and collapse: Secondary | ICD-10-CM | POA: Insufficient documentation

## 2011-06-12 LAB — GLUCOSE, CAPILLARY: Glucose-Capillary: 90 mg/dL (ref 70–99)

## 2011-06-12 NOTE — ED Notes (Signed)
Pt reports was waiting for ride home from work when her hands all of a sudden started shaking and her face started hurting.  Pt states she was getting mad because she couldn't get a ride home.  Reports hands will not move.  Pt hyperventilating upon arrival.  Pt's father reports pt passed out in car en route.  Pt alert and oriented x 3.

## 2011-06-12 NOTE — ED Provider Notes (Addendum)
This chart was scribed for [employer]. Colon Branch, MD by Wallis Mart. The patient was seen in room APA18/APA18 and the patient's care was started at 3:18 PM.    CSN: [id number]  Arrival date & time 06/12/11  1438   First MD Initiated Contact with Patient 06/12/11 1508      Chief Complaint  Patient presents with  . syncopal episode     (Consider location/radiation/quality/duration/timing/severity/associated sxs/prior treatment) HPI Jamie Hendricks is a 21 y.o. female who presents to the Emergency Department complaining of sudden onset, gradually improving, mild symptoms resutling from hyperventilation onset several hours ago.  Pt states that she was tired from work and frustrated from waiting for so long for a ride from work that she hyperventilated.  Pt states that her hands and face started shaking and locking up. Per father, pt passed out in the car en route. Pt states that she had not eaten or drunk anything today.  Pt smokes one pack of cigarettes every 2-3 days.  There are no other associated symptoms and no other alleviating or aggravating factors.  History reviewed. No pertinent past medical history.  History reviewed. No pertinent past surgical history.  No family history on file.  History  Substance Use Topics  . Smoking status: Current Everyday Smoker    Types: Cigarettes  . Smokeless tobacco: Not on file  . Alcohol Use: Yes     social    OB History    Grav Para Term Preterm Abortions TAB SAB Ect Mult Living                  Review of Systems 10 Systems reviewed and all are negative for acute change except as noted in the HPI.   Allergies  Review of patient's allergies indicates no known allergies.  Home Medications  No current outpatient prescriptions on file.  BP 138/73  Pulse 98  Temp(Src) 98.1 F (36.7 C) (Oral)  Resp 26  Ht 5' (1.524 m)  Wt 135 lb (61.236 kg)  BMI 26.37 kg/m2  SpO2 100%  LMP 06/05/2011  Physical Exam  Nursing note and vitals  reviewed. Constitutional: She is oriented to person, place, and time. She appears well-developed and well-nourished. No distress.  HENT:  Head: Normocephalic and atraumatic.  Eyes: EOM are normal. Pupils are equal, round, and reactive to light.  Neck: Normal range of motion. Neck supple. No tracheal deviation present.  Cardiovascular: Normal rate, regular rhythm and normal heart sounds.   Pulmonary/Chest: Effort normal and breath sounds normal. No respiratory distress.  Abdominal: Soft. She exhibits no distension.  Musculoskeletal: Normal range of motion. She exhibits no edema.  Neurological: She is alert and oriented to person, place, and time. No sensory deficit.  Skin: Skin is warm and dry.  Psychiatric: She has a normal mood and affect. Her behavior is normal.    ED Course  Procedures (including critical care time) DIAGNOSTIC STUDIES: Oxygen Saturation is 100% on room air, normal by my interpretation.    Date: 06/12/2011  1447  Rate: 95  Rhythm: normal sinus rhythm  QRS Axis: normal  Intervals: normal  ST/T Wave abnormalities: nonspecific T wave changes  Conduction Disutrbances:none  Narrative Interpretation:   Old EKG Reviewed: none available  COORDINATION OF CARE:  3:20 PM: Pt evaluated, physical examination complete.   Results for orders placed during the hospital encounter of 06/12/11  GLUCOSE, CAPILLARY      Component Value Range   Glucose-Capillary 90  70 - 99 (mg/dL)  MDM  The patient here with an episode of syncope precipitated by hyperventilation. Currently she is stable. Orthostatics were negative. She was ambulated in the department without difficulty.Pt stable in ED with no significant deterioration in condition.The patient appears reasonably screened and/or stabilized for discharge and I doubt any other medical condition or other [facility] requiring further screening, evaluation, or treatment in the ED at this time prior to discharge.  I personally performed  the services described in this documentation, which was scribed in my presence. The recorded information has been reviewed and considered.   MDM Reviewed: nursing note and vitals  EKG reviewed         Nicoletta Dress. Colon Branch, MD 06/12/11 1545  Nicoletta Dress. Colon Branch, MD 06/12/11 [phone]

## 2011-06-12 NOTE — Discharge Instructions (Signed)
Try to eat regularly. Be sure to drink lots of fluids.    Hyperventilation Syndrome Hyperventilation is fast and shallow breathing. This type of breathing may actually make you feel breathless. This type of breathing may make you think you have to catch your breath. Because of feeling like you cannot get enough air, you have been breathing more than normal (overbreathing). You are blowing off too much carbon dioxide which changes your acid-base balance. This leads to the tingling and numb feelings in your fingers, toes, and around the mouth. If this continues your fingers, hands and toes may go into spasm and curl up. You may feel as though you are going to die. Hyperventilation is usually associated with anxiety and panicky feelings. Often feelings of panic and rapid breathing become a vicious cycle. Panic leads to rapid breathing while breathing rapidly can make you feel panicky.  If you hyperventilate you may not even be aware of it. Instead you may be aware of the associated symptoms including:  Chest pains.   Palpitations.   Dizziness.   Lightheadedness.   Dry mouth.   Weakness.   Confusion.   Tingling sensations.   Sleep disturbance.  One common question is, "Why did this happen to me while I was sleeping. I was not anxious." The answer is unknown. One [place] is that it may occur during dream activity. It may disturb you more when it wakes you from a sound sleep. CAUSES  Although anxiety and/or panic attacks are some of the most common causes for the syndrome, other causes include:  Nervousness.   Stress.   Stimulant use.   Lung Disease.   Infections like pneumonia.   Heart problems.   Severe pain.   Waking from a bad dream.   Drugs or alcohol.   Pregnancy   Bleeding.  TREATMENT  Re-breathing into a paper bag (NOT plastic) or mask with a tube on it in the hospital or ambulance physically changes the carbon dioxide level. The breathing rate will slow down. The  numbness and tingling will slowly go away. You might still feel shaky.   If anxiety or panic persist, a therapist or psychiatrist may be helpful in understanding and treating the condition.   Learn breathing exercises that help you breathe from your diaphragm and abdomen.   Practice relaxation techniques such as visualization, meditation, and muscle release.  Document Released: 01/30/2000 Document Revised: 01/21/2011 Document Reviewed: 11/19/2008 [facility] Patient Information 2012 [place], [place].

## 2013-01-27 ENCOUNTER — Emergency Department (HOSPITAL_COMMUNITY)
Admission: EM | Admit: 2013-01-27 | Discharge: 2013-01-27 | Disposition: A | Discharge status: Home / Self Care | Payer: Medicaid Other | Attending: Emergency Medicine | Admitting: Emergency Medicine

## 2013-01-27 ENCOUNTER — Encounter (HOSPITAL_COMMUNITY): Payer: Self-pay | Admitting: Emergency Medicine

## 2013-01-27 DIAGNOSIS — F17200 Nicotine dependence, unspecified, uncomplicated: Secondary | ICD-10-CM | POA: Insufficient documentation

## 2013-01-27 DIAGNOSIS — R1084 Generalized abdominal pain: Secondary | ICD-10-CM | POA: Insufficient documentation

## 2013-01-27 DIAGNOSIS — R000 Tachycardia, unspecified: Secondary | ICD-10-CM | POA: Insufficient documentation

## 2013-01-27 DIAGNOSIS — R059 Cough, unspecified: Secondary | ICD-10-CM | POA: Insufficient documentation

## 2013-01-27 DIAGNOSIS — F411 Generalized anxiety disorder: Secondary | ICD-10-CM | POA: Insufficient documentation

## 2013-01-27 DIAGNOSIS — R109 Unspecified abdominal pain: Secondary | ICD-10-CM

## 2013-01-27 DIAGNOSIS — R0602 Shortness of breath: Secondary | ICD-10-CM | POA: Insufficient documentation

## 2013-01-27 DIAGNOSIS — Z3202 Encounter for pregnancy test, result negative: Secondary | ICD-10-CM | POA: Insufficient documentation

## 2013-01-27 DIAGNOSIS — R1110 Vomiting, unspecified: Secondary | ICD-10-CM | POA: Insufficient documentation

## 2013-01-27 DIAGNOSIS — R079 Chest pain, unspecified: Secondary | ICD-10-CM | POA: Insufficient documentation

## 2013-01-27 DIAGNOSIS — R05 Cough: Secondary | ICD-10-CM | POA: Insufficient documentation

## 2013-01-27 LAB — URINALYSIS, ROUTINE W REFLEX MICROSCOPIC
Bilirubin Urine: NEGATIVE
Nitrite: NEGATIVE
Protein, ur: NEGATIVE mg/dL
Specific Gravity, Urine: 1.02 (ref 1.005–1.030)
Urobilinogen, UA: 0.2 mg/dL (ref 0.0–1.0)

## 2013-01-27 LAB — URINE MICROSCOPIC-ADD ON

## 2013-01-27 LAB — POCT PREGNANCY, URINE: Preg Test, Ur: NEGATIVE

## 2013-01-27 MED ORDER — FAMOTIDINE 20 MG PO TABS: 20.0000 mg | ORAL_TABLET | Freq: Two times a day (BID) | ORAL | Status: DC

## 2013-01-27 MED ORDER — GI COCKTAIL ~~LOC~~
30.0000 mL | Freq: Once | ORAL | Status: AC
  Administered 2013-01-27: 30 mL via ORAL
  Filled 2013-01-27: qty 30

## 2013-01-27 MED ORDER — LORAZEPAM 1 MG PO TABS
1.0000 mg | ORAL_TABLET | Freq: Once | ORAL | Status: AC
  Administered 2013-01-27: 1 mg via ORAL
  Filled 2013-01-27: qty 1

## 2013-01-27 NOTE — ED Provider Notes (Signed)
CSN: [id number]     Arrival date & time 01/27/13  4098 History  This chart was scribed for Raeford Razor, MD by Luisa Dago, ED Scribe. This patient was seen in room APA17/APA17 and the patient's care was started at 9:14 AM.    Chief Complaint  Patient presents with  . Abdominal Pain   The history is provided by the patient. No language interpreter was used.   HPI Comments: Jamie Hendricks is a 22 y.o. female who presents to the Emergency Department complaining of constant diffuse abdominal pain that started 7 am. She describes the pain as a burning sensation that radiates across her lower back. Pt says she was getting ready for work this morning when she started experiencing the abdominal pain. The abdominal pain was associated with emesis, SOB secondary to the pain, and cough. Pt reports that the pain is relieved by bending over. She states that she has experienced this type of abdominal pain before but it usually goes away on its own. She denies taking any pain medication to relieve her symptoms.  Pt states that her last bowel movement was 1 day ago. Pt denies dysuria, fever, chills, and urinary symptoms.   History reviewed. No pertinent past medical history. History reviewed. No pertinent past surgical history. Family History  Problem Relation Age of Onset  . Seizures Other   . Hypertension Other   . Diabetes Other    History  Substance Use Topics  . Smoking status: Current Every Day Smoker -- 0.50 packs/day for 8 years    Types: Cigarettes  . Smokeless tobacco: Never Used  . Alcohol Use: Yes     Comment: social   OB History   Grav Para Term Preterm Abortions TAB SAB Ect Mult Living   2 2 2       2      Review of Systems  Constitutional: Negative for fever and chills.  Respiratory: Positive for cough and shortness of breath.   Gastrointestinal: Positive for vomiting and abdominal pain. Negative for nausea.  Genitourinary: Negative for dysuria.  All other systems reviewed  and are negative.    Allergies  Review of patient's allergies indicates no known allergies.  Home Medications  No current outpatient prescriptions on file. Triage Vitals:BP 112/75  Pulse 87  Resp 20  Ht 5' (1.524 m)  Wt 135 lb (61.236 kg)  BMI 26.37 kg/m2  SpO2 100%  LMP 12/28/2012 Physical Exam  Nursing note and vitals reviewed. Constitutional: She appears well-developed and well-nourished. No distress.  Pt leaned forward at the waist upon entering the room. Appears uncomfortable and anxious  HENT:  Head: Normocephalic and atraumatic.  Eyes: Conjunctivae are normal. Right eye exhibits no discharge. Left eye exhibits no discharge.  Neck: Neck supple.  Cardiovascular: Regular rhythm and normal heart sounds.  Exam reveals no gallop and no friction rub.   No murmur heard. Tachycardic.  Pulmonary/Chest: Effort normal and breath sounds normal. No respiratory distress.  Abdominal: Soft. She exhibits no distension. There is no tenderness.  Musculoskeletal: She exhibits no edema and no tenderness.  Neurological: She is alert.  Skin: Skin is warm and dry.  Psychiatric: Her behavior is normal. Thought content normal. Her mood appears anxious.    ED Course  Procedures (including critical care time)  DIAGNOSTIC STUDIES: Oxygen Saturation is 100% on room air, normal by my interpretation.    COORDINATION OF CARE: 9:15 AM-Discussed treatment plan which includes pain medication and a UA with pt at bedside and pt  agreed to plan.   10:21 AM- Pt is feeling better and looks more comfortable. Discussed with pt of possible acid reflux diagnosis. Prescribed pt acid reflux medication with pt at bedside and pt agreed to plan. Told patient that her smoking worsens the acid reflux and she should stop   Labs Review Labs Reviewed  URINALYSIS, ROUTINE W REFLEX MICROSCOPIC - Abnormal; Notable for the following:    Hgb urine dipstick TRACE (*)    All other components within normal limits  URINE  MICROSCOPIC-ADD ON - Abnormal; Notable for the following:    Squamous Epithelial / LPF FEW (*)    Bacteria, UA FEW (*)    All other components within normal limits  POCT PREGNANCY, URINE   Imaging Review No results found.  EKG Interpretation   None       MDM   1. Abdominal pain    22 year old female with upper abdominal and chest pain. Suspect  symptoms secondary to reflux. Plan course of a H2 blocker. Outpatient followup.  I personally preformed the services scribed in my presence. The recorded information has been reviewed is accurate. Raeford Razor, MD.    Raeford Razor, MD 01/31/13 2123

## 2013-01-27 NOTE — ED Notes (Signed)
Patient c/o generalized abd pain. Per patient woke with pain this morning and pain progressively getting worse. Patient reports nausea with vomiting. Denies any fevers, diarrhea, or urinary symptoms.

## 2013-02-15 NOTE — L&D Delivery Note (Signed)
Delivery Note Pt had about 30 minutes of bradycardia with baseline 90-100, most likely dt rapid progression.  During this time, pt progressed quickly and steadily to C/C/+2.  After a 20 minute 2nd stage, at 9:57 PM a viable female was delivered via Vaginal, Spontaneous Delivery (Presentation: Left Occiput Anterior).  APGAR: 9, 9; weight pending. 40 units of pitocin diluted in 1000cc LR was infused rapidly IV.  The placenta separated spontaneously and delivered via CCT and maternal pushing effort.  It was inspected and appears to be intact with a 2 VC. To pathology.  PAnesthesia: Epidural  Episiotomy: None Lacerations:  none Suture Repair: n/a Est. Blood Loss (mL):  300  Mom to postpartum.  Baby to Couplet care / Skin to Skin.  Jamie Hendricks,Jamie Hendricks 01/31/2014, 10:12 PM

## 2013-04-01 ENCOUNTER — Encounter (HOSPITAL_COMMUNITY): Payer: Self-pay | Admitting: Emergency Medicine

## 2013-04-01 ENCOUNTER — Emergency Department (HOSPITAL_COMMUNITY)
Admission: EM | Admit: 2013-04-01 | Discharge: 2013-04-01 | Disposition: A | Discharge status: Home / Self Care | Payer: Medicaid Other | Attending: Emergency Medicine | Admitting: Emergency Medicine

## 2013-04-01 ENCOUNTER — Emergency Department (HOSPITAL_COMMUNITY): Payer: Medicaid Other

## 2013-04-01 DIAGNOSIS — N730 Acute parametritis and pelvic cellulitis: Secondary | ICD-10-CM

## 2013-04-01 DIAGNOSIS — F17200 Nicotine dependence, unspecified, uncomplicated: Secondary | ICD-10-CM | POA: Insufficient documentation

## 2013-04-01 DIAGNOSIS — Z79899 Other long term (current) drug therapy: Secondary | ICD-10-CM | POA: Insufficient documentation

## 2013-04-01 DIAGNOSIS — Z3202 Encounter for pregnancy test, result negative: Secondary | ICD-10-CM | POA: Insufficient documentation

## 2013-04-01 LAB — URINALYSIS, ROUTINE W REFLEX MICROSCOPIC
BILIRUBIN URINE: NEGATIVE
Glucose, UA: NEGATIVE mg/dL
KETONES UR: NEGATIVE mg/dL
LEUKOCYTES UA: NEGATIVE
NITRITE: NEGATIVE
Protein, ur: NEGATIVE mg/dL
SPECIFIC GRAVITY, URINE: 1.025 (ref 1.005–1.030)
UROBILINOGEN UA: 0.2 mg/dL (ref 0.0–1.0)
pH: 6 (ref 5.0–8.0)

## 2013-04-01 LAB — CBC WITH DIFFERENTIAL/PLATELET
BASOS ABS: 0 10*3/uL (ref 0.0–0.1)
Basophils Relative: 1 % (ref 0–1)
EOS ABS: 0.3 10*3/uL (ref 0.0–0.7)
EOS PCT: 4 % (ref 0–5)
HCT: 40.2 % (ref 36.0–46.0)
Hemoglobin: 13.5 g/dL (ref 12.0–15.0)
Lymphocytes Relative: 34 % (ref 12–46)
Lymphs Abs: 2 10*3/uL (ref 0.7–4.0)
MCH: 29.9 pg (ref 26.0–34.0)
MCHC: 33.6 g/dL (ref 30.0–36.0)
MCV: 88.9 fL (ref 78.0–100.0)
Monocytes Absolute: 0.4 10*3/uL (ref 0.1–1.0)
Monocytes Relative: 6 % (ref 3–12)
Neutro Abs: 3.2 10*3/uL (ref 1.7–7.7)
Neutrophils Relative %: 54 % (ref 43–77)
PLATELETS: 367 10*3/uL (ref 150–400)
RBC: 4.52 MIL/uL (ref 3.87–5.11)
RDW: 13.5 % (ref 11.5–15.5)
WBC: 5.9 10*3/uL (ref 4.0–10.5)

## 2013-04-01 LAB — URINE MICROSCOPIC-ADD ON

## 2013-04-01 LAB — WET PREP, GENITAL
Trich, Wet Prep: NONE SEEN
YEAST WET PREP: NONE SEEN

## 2013-04-01 LAB — BASIC METABOLIC PANEL
BUN: 6 mg/dL (ref 6–23)
CALCIUM: 9.3 mg/dL (ref 8.4–10.5)
CO2: 25 mEq/L (ref 19–32)
Chloride: 104 mEq/L (ref 96–112)
Creatinine, Ser: 0.75 mg/dL (ref 0.50–1.10)
Glucose, Bld: 88 mg/dL (ref 70–99)
Potassium: 4.3 mEq/L (ref 3.7–5.3)
SODIUM: 138 meq/L (ref 137–147)

## 2013-04-01 LAB — PREGNANCY, URINE: Preg Test, Ur: NEGATIVE

## 2013-04-01 MED ORDER — ONDANSETRON HCL 4 MG/2ML IJ SOLN
4.0000 mg | Freq: Once | INTRAMUSCULAR | Status: AC
  Administered 2013-04-01: 4 mg via INTRAVENOUS
  Filled 2013-04-01: qty 2

## 2013-04-01 MED ORDER — CEFTRIAXONE SODIUM 1 G IJ SOLR
1.0000 g | Freq: Once | INTRAMUSCULAR | Status: AC
  Administered 2013-04-01: 1 g via INTRAVENOUS
  Filled 2013-04-01 (×2): qty 10

## 2013-04-01 MED ORDER — DOXYCYCLINE HYCLATE 100 MG PO CAPS: 100.0000 mg | ORAL_CAPSULE | Freq: Two times a day (BID) | ORAL | Status: DC

## 2013-04-01 MED ORDER — IOHEXOL 300 MG/ML  SOLN
50.0000 mL | Freq: Once | INTRAMUSCULAR | Status: AC | PRN
  Administered 2013-04-01: 50 mL via ORAL

## 2013-04-01 MED ORDER — MORPHINE SULFATE 4 MG/ML IJ SOLN
4.0000 mg | Freq: Once | INTRAMUSCULAR | Status: AC
  Administered 2013-04-01: 4 mg via INTRAVENOUS
  Filled 2013-04-01: qty 1

## 2013-04-01 MED ORDER — IOHEXOL 300 MG/ML  SOLN
100.0000 mL | Freq: Once | INTRAMUSCULAR | Status: AC | PRN
  Administered 2013-04-01: 100 mL via INTRAVENOUS

## 2013-04-01 MED ORDER — HYDROCODONE-ACETAMINOPHEN 5-325 MG PO TABS: 2.0000 | ORAL_TABLET | ORAL | Status: DC | PRN

## 2013-04-01 NOTE — ED Notes (Signed)
Complain of low back and lower abdominal pain

## 2013-04-01 NOTE — Discharge Instructions (Signed)
Pelvic Inflammatory Disease  Pelvic inflammatory disease (PID) refers to an infection in some or all of the female organs. The infection can be in the uterus, ovaries, fallopian tubes, or the surrounding tissues in the pelvis. PID can cause abdominal or pelvic pain that comes on suddenly (acute pelvic pain). PID is a serious infection because it can lead to lasting (chronic) pelvic pain or the inability to have children (infertile).   CAUSES   The infection is often caused by the normal bacteria found in the vaginal tissues. PID may also be caused by an infection that is spread during sexual contact. PID can also occur following:   · The birth of a baby.    · A miscarriage.    · An abortion.    · Major pelvic surgery.    · The use of an intrauterine device (IUD).    · A sexual assault.    RISK FACTORS  Certain factors can put a person at higher risk for PID, such as:  · Being younger than 25 years.  · Being sexually active at a young age.  · Using nonbarrier contraception.  · Having multiple sexual partners.  · Having sex with someone who has symptoms of a genital infection.  · Using oral contraception.  Other times, certain behaviors can increase the possibility of getting PID, such as:  · Having sex during your period.  · Using a vaginal douche.  · Having an intrauterine device (IUD) in place.  SYMPTOMS   · Abdominal or pelvic pain.    · Fever.    · Chills.    · Abnormal vaginal discharge.  · Abnormal uterine bleeding.    · Unusual pain shortly after finishing your period.  DIAGNOSIS   Your caregiver will choose some of the following methods to make a diagnosis, such as:   · Performing a physical exam and history. A pelvic exam typically reveals a very tender uterus and surrounding pelvis.    · Ordering laboratory tests including a pregnancy test, blood tests, and urine test.   · Ordering cultures of the vagina and cervix to check for a sexually transmitted infection (STI).  · Performing an ultrasound.     · Performing a laparoscopic procedure to look inside the pelvis.    TREATMENT   · Antibiotic medicines may be prescribed and taken by mouth.    · Sexual partners may be treated when the infection is caused by a sexually transmitted disease (STD).    · Hospitalization may be needed to give antibiotics intravenously.  · Surgery may be needed, but this is rare.  It may take weeks until you are completely well. If you are diagnosed with PID, you should also be checked for human immunodeficiency virus (HIV).    HOME CARE INSTRUCTIONS   · If given, take your antibiotics as directed. Finish the medicine even if you start to feel better.    · Only take over-the-counter or prescription medicines for pain, discomfort, or fever as directed by your caregiver.    · Do not have sexual intercourse until treatment is completed or as directed by your caregiver. If PID is confirmed, your recent sexual partner(s) will need treatment.    · Keep your follow-up appointments.  SEEK MEDICAL CARE IF:   · You have increased or abnormal vaginal discharge.    · You need prescription medicine for your pain.    · You vomit.    · You cannot take your medicines.    · Your partner has an STD.    SEEK IMMEDIATE MEDICAL CARE IF:   · You have a fever.    · You have increased abdominal or   pelvic pain.    · You have chills.    · You have pain when you urinate.    · You are not better after 72 hours following treatment.    MAKE SURE YOU:   · Understand these instructions.  · Will watch your condition.  · Will get help right away if you are not doing well or get worse.  Document Released: 02/01/2005 Document Revised: 05/29/2012 Document Reviewed: 01/28/2011  [employer]® Patient Information ©2014 [employer].

## 2013-04-01 NOTE — ED Provider Notes (Signed)
CSN: [id number]     Arrival date & time 04/01/13  1238 History   This chart was scribed for Jamie Hendricks, * by Manuela Schwartzaylor Day, ED scribe. This patient was seen in room APA02/APA02 and the patient's care was started at 1238.  Chief Complaint  Patient presents with  . Abdominal Pain   The history is provided by the patient. No language interpreter was used.   HPI Comments: Jamie Hendricks is a 23 y.o. female who presents to the Emergency Department complaining of constant, sharp bilateral lower back pain that radiates around her waist to her bilateral lower abdomen, onset 2 days ago. She reports pain is worse w/movement. She denies frequency, dysuria, vaginal d/c or bleeding. She reports voiding urine seems to improve "pressure." She reports similar previous episodes of "various things". LNMP just finished cycle today.  No hx of abdominal surgeries  History reviewed. No pertinent past medical history. History reviewed. No pertinent past surgical history. Family History  Problem Relation Age of Onset  . Seizures Other   . Hypertension Other   . Diabetes Other    History  Substance Use Topics  . Smoking status: Current Every Day Smoker -- 0.50 packs/day for 8 years    Types: Cigarettes  . Smokeless tobacco: Never Used  . Alcohol Use: Yes     Comment: social   OB History   Grav Para Term Preterm Abortions TAB SAB Ect Mult Living   2 2 2       2      Review of Systems  Constitutional: Negative for fever and chills.  Respiratory: Negative for cough and shortness of breath.   Cardiovascular: Negative for chest pain.  Gastrointestinal: Positive for abdominal pain. Negative for nausea, vomiting and diarrhea.  Musculoskeletal: Positive for back pain.  Skin: Negative for color change.  All other systems reviewed and are negative.   Allergies  Review of patient's allergies indicates no known allergies.  Home Medications   Current Outpatient Rx  Name  Route  Sig  Dispense   Refill  . famotidine (PEPCID) 20 MG tablet   Oral   Take 1 tablet (20 mg total) by mouth 2 (two) times daily.   60 tablet   1   . acetaminophen (TYLENOL) 500 MG tablet   Oral   Take 1,000 mg by mouth every 6 (six) hours as needed.         . doxycycline (VIBRAMYCIN) 100 MG capsule   Oral   Take 1 capsule (100 mg total) by mouth 2 (two) times daily.   20 capsule   0   . HYDROcodone-acetaminophen (NORCO/VICODIN) 5-325 MG per tablet   Oral   Take 2 tablets by mouth every 4 (four) hours as needed for moderate pain.   20 tablet   0    Triage Vitals: BP 125/62  Pulse 99  Temp(Src) 98.2 F (36.8 C) (Oral)  Ht 5' (1.524 m)  Wt 146 lb 8 oz (66.452 kg)  BMI 28.61 kg/m2  SpO2 100%  LMP 03/30/2013  Physical Exam  Nursing note and vitals reviewed. Constitutional: She is oriented to person, place, and time. She appears well-developed and well-nourished. No distress.  HENT:  Head: Normocephalic and atraumatic.  Right Ear: Hearing normal.  Left Ear: Hearing normal.  Nose: Nose normal.  Mouth/Throat: Oropharynx is clear and moist and mucous membranes are normal.  Eyes: Conjunctivae and EOM are normal. Pupils are equal, round, and reactive to light.  Neck: Normal range of motion.  Neck supple.  Cardiovascular: Normal rate, regular rhythm, S1 normal and S2 normal.  Exam reveals no gallop and no friction rub.   No murmur heard. Pulmonary/Chest: Effort normal and breath sounds normal. No respiratory distress. She exhibits no tenderness.  Abdominal: Soft. Normal appearance and bowel sounds are normal. There is no hepatosplenomegaly. There is tenderness. There is no rebound, no guarding, no tenderness at McBurney's point and negative Murphy's sign. No hernia.  Mild tenderness diffusely across lower abdomen: RLQ, suprapubic and LLQ  Genitourinary: Vagina normal. There is no rash on the right labia. There is no rash on the left labia. Cervix exhibits motion tenderness and discharge. Right  adnexum displays no mass and no tenderness. Left adnexum displays no mass and no tenderness.  Musculoskeletal: Normal range of motion.  Neurological: She is alert and oriented to person, place, and time. She has normal strength. No cranial nerve deficit or sensory deficit. Coordination normal. GCS eye subscore is 4. GCS verbal subscore is 5. GCS motor subscore is 6.  Skin: Skin is warm, dry and intact. No rash noted. No cyanosis.  Psychiatric: She has a normal mood and affect. Her speech is normal and behavior is normal. Thought content normal.   ED Course  Procedures (including critical care time) DIAGNOSTIC STUDIES: Oxygen Saturation is 100% on room air, normal by my interpretation.    COORDINATION OF CARE: At 115 PM Discussed treatment plan with patient which includes UA, pregnancy UA., blod work, pain/nausea medicine, wet prep, gc/chlamydia probe. Patient agrees.   Labs Review Labs Reviewed  WET PREP, GENITAL - Abnormal; Notable for the following:    Clue Cells Wet Prep HPF POC FEW (*)    WBC, Wet Prep HPF POC MANY (*)    All other components within normal limits  URINALYSIS, ROUTINE W REFLEX MICROSCOPIC - Abnormal; Notable for the following:    Hgb urine dipstick SMALL (*)    All other components within normal limits  GC/CHLAMYDIA PROBE AMP  PREGNANCY, URINE  CBC WITH DIFFERENTIAL  BASIC METABOLIC PANEL  URINE MICROSCOPIC-ADD ON   Imaging Review Ct Abdomen Pelvis W Contrast  04/01/2013   CLINICAL DATA:  Lower abdominal pain.  EXAM: CT ABDOMEN AND PELVIS WITH CONTRAST  TECHNIQUE: Multidetector CT imaging of the abdomen and pelvis was performed using the standard protocol following bolus administration of intravenous contrast.  CONTRAST:  50mL OMNIPAQUE IOHEXOL 300 MG/ML SOLN, OMNIPAQUE IOHEXOL 300 MG/ML SOLN  COMPARISON:  None.  FINDINGS: Visualized lung bases appear normal. No osseous abnormality is noted.  44 x 19 x 14 mm low density is seen along the inferior portion of  the left hepatic lobe with an average Hounsfield measurement of 74; this may represent fatty infiltration, but MRI scan of the liver is recommended for further evaluation. The spleen and pancreas appear normal. No gallstones are noted. Adrenal glands and kidneys appear normal. No hydronephrosis or renal obstruction is noted. There is no evidence of bowel obstruction. The appendix appears normal. No abnormal fluid collection is noted. Urinary bladder and uterus appear normal. 2.5 cm left ovarian cyst is noted which most likely is physiologic. Bilateral inguinal adenopathy is noted with the largest lymph node measured 18 x 12 mm on the left side.  IMPRESSION: Area of ill-defined low density seen along the inferior portion of the left hepatic lobe which may simply represent focal fatty infiltration, but MRI is recommended to rule out neoplasm.  Bilateral inguinal adenopathy is noted which may simply be inflammatory or reactive  in etiology, but clinical correlation is recommended to rule out potential neoplasm.   Electronically Signed   By: Roque Lias M.D.   On: 04/01/2013 17:55    EKG Interpretation   None      MDM   Final diagnoses:  PID (acute pelvic inflammatory disease)   Patient presents to ER for evaluation of low back pain and low abdominal and pelvic pain. Workup included blood work, urinalysis. These were unremarkable. Pelvic exam did reveal cervical motion tenderness with discharge. PID is suspected, a CAT scan was ordered to further evaluate for other pelvic pathology as well as to look for tubo-ovarian abscess. CAT scan is showing acute pathology. Low density area in the hepatic lobe, recommend followup. Patient is primary-care physician last followup with GI.  Patient Rocephin here in the ER. He'll be continued on doxycycline and analgesia, followup with OB/GYN.  I personally performed the services described in this documentation, which was scribed in my presence. The recorded  information has been reviewed and is accurate.       Jamie Crease, MD 04/01/13 [id number]

## 2013-04-02 LAB — GC/CHLAMYDIA PROBE AMP
CT PROBE, AMP APTIMA: NEGATIVE
GC PROBE AMP APTIMA: NEGATIVE

## 2013-06-19 ENCOUNTER — Encounter: Payer: Self-pay | Admitting: Adult Health

## 2013-06-19 ENCOUNTER — Ambulatory Visit (INDEPENDENT_AMBULATORY_CARE_PROVIDER_SITE_OTHER): Payer: Medicaid Other | Admitting: Adult Health

## 2013-06-19 VITALS — BP 130/80 | Ht 60.0 in | Wt 145.0 lb

## 2013-06-19 DIAGNOSIS — Z3200 Encounter for pregnancy test, result unknown: Secondary | ICD-10-CM

## 2013-06-19 DIAGNOSIS — Z3201 Encounter for pregnancy test, result positive: Secondary | ICD-10-CM

## 2013-06-19 LAB — POCT URINE PREGNANCY: Preg Test, Ur: POSITIVE

## 2013-07-06 ENCOUNTER — Other Ambulatory Visit: Payer: Self-pay | Admitting: Obstetrics and Gynecology

## 2013-07-06 DIAGNOSIS — O3680X0 Pregnancy with inconclusive fetal viability, not applicable or unspecified: Secondary | ICD-10-CM

## 2013-07-10 ENCOUNTER — Other Ambulatory Visit: Payer: Self-pay | Admitting: Obstetrics and Gynecology

## 2013-07-10 ENCOUNTER — Ambulatory Visit (INDEPENDENT_AMBULATORY_CARE_PROVIDER_SITE_OTHER): Payer: Medicaid Other

## 2013-07-10 DIAGNOSIS — O26849 Uterine size-date discrepancy, unspecified trimester: Secondary | ICD-10-CM

## 2013-07-10 DIAGNOSIS — O3680X0 Pregnancy with inconclusive fetal viability, not applicable or unspecified: Secondary | ICD-10-CM

## 2013-07-10 NOTE — Progress Notes (Signed)
U/S-single IUP with +FCA noted, FHR-165 bpm, cx appears long and closed, bilateral adnexa appears WNL, CRL c/w 7+6wks EDD 02/20/2014, +YS noted

## 2013-07-19 ENCOUNTER — Encounter: Payer: Medicaid Other | Admitting: Adult Health

## 2013-07-23 ENCOUNTER — Encounter: Payer: Medicaid Other | Admitting: Women's Health

## 2013-07-30 ENCOUNTER — Ambulatory Visit (INDEPENDENT_AMBULATORY_CARE_PROVIDER_SITE_OTHER): Payer: Medicaid Other | Admitting: Adult Health

## 2013-07-30 ENCOUNTER — Other Ambulatory Visit (HOSPITAL_COMMUNITY)
Admission: RE | Admit: 2013-07-30 | Discharge: 2013-07-30 | Disposition: A | Discharge status: Home / Self Care | Payer: Medicaid Other | Source: Ambulatory Visit | Attending: Adult Health | Admitting: Adult Health

## 2013-07-30 ENCOUNTER — Encounter: Payer: Self-pay | Admitting: Adult Health

## 2013-07-30 VITALS — BP 100/60 | Wt 139.5 lb

## 2013-07-30 DIAGNOSIS — Z331 Pregnant state, incidental: Secondary | ICD-10-CM

## 2013-07-30 DIAGNOSIS — O0993 Supervision of high risk pregnancy, unspecified, third trimester: Secondary | ICD-10-CM | POA: Insufficient documentation

## 2013-07-30 DIAGNOSIS — Z01419 Encounter for gynecological examination (general) (routine) without abnormal findings: Secondary | ICD-10-CM | POA: Insufficient documentation

## 2013-07-30 DIAGNOSIS — Z3480 Encounter for supervision of other normal pregnancy, unspecified trimester: Secondary | ICD-10-CM

## 2013-07-30 DIAGNOSIS — Z1389 Encounter for screening for other disorder: Secondary | ICD-10-CM

## 2013-07-30 DIAGNOSIS — Z113 Encounter for screening for infections with a predominantly sexual mode of transmission: Secondary | ICD-10-CM | POA: Insufficient documentation

## 2013-07-30 HISTORY — DX: Encounter for supervision of other normal pregnancy, unspecified trimester: Z34.80

## 2013-07-30 LAB — POCT URINALYSIS DIPSTICK
GLUCOSE UA: NEGATIVE
KETONES UA: NEGATIVE
Nitrite, UA: NEGATIVE
Protein, UA: NEGATIVE

## 2013-07-30 NOTE — Addendum Note (Signed)
Addended by: Colen DarlingYOUNG, Madelyne Millikan S on: 07/30/2013 01:52 PM   Modules accepted: Orders

## 2013-07-30 NOTE — Progress Notes (Signed)
Subjective:  Jamie Hendricks is a 23 y.o. 523P2002 African American female at 844w5d by US being seen today for her first obstetrical visit.  Her obstetrical history is significant for had retainted placenta x 2 with bleeding, did not have D&C.  Pregnancy history fully reviewed.  Patient reports backache try tylenol and ice. Denies vb, cramping, uti s/s, abnormal/malodorous vag d/c, or vulvovaginal itching/irritation.  BP 100/60  Wt 139 lb 8 oz (63.277 kg)  LMP 05/09/2013  HISTORY: OB History  Gravida Para Term Preterm AB SAB TAB Ectopic Multiple Living  3 2 2       2     # Outcome Date GA Lbr Len/2nd Weight Sex Delivery Anes PTL Lv  3 CUR           2 TRM 02/16/10 7345w0d  6 lb (2.722 kg) F SVD EPI  Y  1 TRM 02/07/06 7645w0d  6 lb 14 oz (3.118 kg) F SVD EPI  Y     Past Medical History  Diagnosis Date  . Medical history non-contributory   . Supervision of other normal pregnancy 07/30/2013   Past Surgical History  Procedure Laterality Date  . No past surgeries     Family History  Problem Relation Age of Onset  . Seizures Other   . Hypertension Other   . Diabetes Other   . Diabetes Father   . Hypertension Maternal Grandmother   . Diabetes Paternal Grandfather     Exam   System:     General: Well developed & nourished, no acute distress   Skin: Warm & dry, normal coloration and turgor, no rashes   Neurologic: Alert & oriented, normal mood   Cardiovascular: Regular rate & rhythm   Respiratory: Effort & rate normal, LCTAB, acyanotic   Abdomen: Soft, non tender   Extremities: normal strength, tone                              Thyroid normal Pelvic Exam:    Perineum: Normal perineum   Vulva: Normal, no lesions   Vagina:  Normal mucosa, normal discharge   Cervix: Normal, bulbous, appears closed   Uterus: Normal size/shape/contour for GA   Thin prep pap smear  With GC/CHL FHR:+ via US   Assessment:   Pregnancy: [id number] Patient Active Problem List   Diagnosis Date Noted   . Supervision of other normal pregnancy 07/30/2013    3944w5d [id number] New OB visit     Plan:  Initial labs drawn Continue prenatal vitamins Problem list reviewed and updated Reviewed n/v relief measures and warning s/s to report Reviewed recommended weight gain based on pre-gravid BMI Encouraged well-balanced diet Genetic Screening discussed Integrated Screen: declined Cystic fibrosis screening discussed declined Ultrasound discussed; fetal survey: requested Follow up in 4 weeks for OB visit  Adline PotterJennifer A. Griffin, NP 07/30/2013 12:43 PM

## 2013-07-30 NOTE — Patient Instructions (Signed)

## 2013-07-31 LAB — URINALYSIS
BILIRUBIN URINE: NEGATIVE
Glucose, UA: NEGATIVE mg/dL
Hgb urine dipstick: NEGATIVE
Ketones, ur: NEGATIVE mg/dL
Leukocytes, UA: NEGATIVE
Nitrite: NEGATIVE
Protein, ur: NEGATIVE mg/dL
SPECIFIC GRAVITY, URINE: 1.012 (ref 1.005–1.030)
Urobilinogen, UA: 0.2 mg/dL (ref 0.0–1.0)
pH: 8 (ref 5.0–8.0)

## 2013-07-31 LAB — RUBELLA SCREEN: Rubella: 2.05 Index — ABNORMAL HIGH (ref <0.90)

## 2013-07-31 LAB — CBC
HEMATOCRIT: 36.2 % (ref 36.0–46.0)
HEMOGLOBIN: 11.6 g/dL — AB (ref 12.0–15.0)
MCH: 28.2 pg (ref 26.0–34.0)
MCHC: 32 g/dL (ref 30.0–36.0)
MCV: 87.9 fL (ref 78.0–100.0)
Platelets: 328 10*3/uL (ref 150–400)
RBC: 4.12 MIL/uL (ref 3.87–5.11)
RDW: 13.6 % (ref 11.5–15.5)
WBC: 6.5 10*3/uL (ref 4.0–10.5)

## 2013-07-31 LAB — DRUG SCREEN, URINE, NO CONFIRMATION
Amphetamine Screen, Ur: NEGATIVE
BENZODIAZEPINES.: NEGATIVE
Barbiturate Quant, Ur: NEGATIVE
COCAINE METABOLITES: NEGATIVE
Creatinine,U: 111.8 mg/dL
Marijuana Metabolite: POSITIVE — AB
Methadone: NEGATIVE
Opiate Screen, Urine: NEGATIVE
PHENCYCLIDINE (PCP): NEGATIVE
Propoxyphene: NEGATIVE

## 2013-07-31 LAB — ABO AND RH: Rh Type: POSITIVE

## 2013-07-31 LAB — VARICELLA ZOSTER ANTIBODY, IGG: Varicella IgG: 738 Index — ABNORMAL HIGH (ref <135.00)

## 2013-07-31 LAB — ANTIBODY SCREEN: Antibody Screen: NEGATIVE

## 2013-07-31 LAB — OXYCODONE SCREEN, UA, RFLX CONFIRM: OXYCODONE SCRN UR: NEGATIVE ng/mL

## 2013-07-31 LAB — HEPATITIS B SURFACE ANTIGEN: Hepatitis B Surface Ag: NEGATIVE

## 2013-07-31 LAB — HIV ANTIBODY (ROUTINE TESTING W REFLEX): HIV 1&2 Ab, 4th Generation: NONREACTIVE

## 2013-07-31 LAB — RPR

## 2013-07-31 LAB — TSH: TSH: 0.63 u[IU]/mL (ref 0.350–4.500)

## 2013-08-01 LAB — URINE CULTURE
COLONY COUNT: NO GROWTH
ORGANISM ID, BACTERIA: NO GROWTH

## 2013-08-02 ENCOUNTER — Encounter: Payer: Self-pay | Admitting: Adult Health

## 2013-08-02 LAB — CYTOLOGY - PAP

## 2013-08-27 ENCOUNTER — Ambulatory Visit (INDEPENDENT_AMBULATORY_CARE_PROVIDER_SITE_OTHER): Payer: Medicaid Other | Admitting: Women's Health

## 2013-08-27 ENCOUNTER — Encounter: Payer: Self-pay | Admitting: Women's Health

## 2013-08-27 VITALS — BP 100/58 | Wt 137.0 lb

## 2013-08-27 DIAGNOSIS — Z3482 Encounter for supervision of other normal pregnancy, second trimester: Secondary | ICD-10-CM

## 2013-08-27 DIAGNOSIS — Z3480 Encounter for supervision of other normal pregnancy, unspecified trimester: Secondary | ICD-10-CM

## 2013-08-27 DIAGNOSIS — Z331 Pregnant state, incidental: Secondary | ICD-10-CM

## 2013-08-27 DIAGNOSIS — O09299 Supervision of pregnancy with other poor reproductive or obstetric history, unspecified trimester: Secondary | ICD-10-CM | POA: Insufficient documentation

## 2013-08-27 DIAGNOSIS — F17200 Nicotine dependence, unspecified, uncomplicated: Secondary | ICD-10-CM

## 2013-08-27 DIAGNOSIS — F1290 Cannabis use, unspecified, uncomplicated: Secondary | ICD-10-CM | POA: Insufficient documentation

## 2013-08-27 DIAGNOSIS — O99330 Smoking (tobacco) complicating pregnancy, unspecified trimester: Secondary | ICD-10-CM

## 2013-08-27 DIAGNOSIS — O09292 Supervision of pregnancy with other poor reproductive or obstetric history, second trimester: Secondary | ICD-10-CM

## 2013-08-27 DIAGNOSIS — Z1389 Encounter for screening for other disorder: Secondary | ICD-10-CM

## 2013-08-27 LAB — POCT URINALYSIS DIPSTICK
Blood, UA: NEGATIVE
GLUCOSE UA: NEGATIVE
Ketones, UA: NEGATIVE
LEUKOCYTES UA: NEGATIVE
Nitrite, UA: NEGATIVE
Protein, UA: NEGATIVE

## 2013-08-27 NOTE — Patient Instructions (Signed)
Second Trimester of Pregnancy The second trimester is from week 13 through week 28, months 4 through 6. The second trimester is often a time when you feel your best. Your body has also adjusted to being pregnant, and you begin to feel better physically. Usually, morning sickness has lessened or quit completely, you may have more energy, and you may have an increase in appetite. The second trimester is also a time when the fetus is growing rapidly. At the end of the sixth month, the fetus is about 9 inches long and weighs about 1 pounds. You will likely begin to feel the baby move (quickening) between 18 and 20 weeks of the pregnancy. BODY CHANGES Your body goes through many changes during pregnancy. The changes vary from woman to woman.   Your weight will continue to increase. You will notice your lower abdomen bulging out.  You may begin to get stretch marks on your hips, abdomen, and breasts.  You may develop headaches that can be relieved by medicines approved by your health care provider.  You may urinate more often because the fetus is pressing on your bladder.  You may develop or continue to have heartburn as a result of your pregnancy.  You may develop constipation because certain hormones are causing the muscles that push waste through your intestines to slow down.  You may develop hemorrhoids or swollen, bulging veins (varicose veins).  You may have back pain because of the weight gain and pregnancy hormones relaxing your joints between the bones in your pelvis and as a result of a shift in weight and the muscles that support your balance.  Your breasts will continue to grow and be tender.  Your gums may bleed and may be sensitive to brushing and flossing.  Dark spots or blotches (chloasma, mask of pregnancy) may develop on your face. This will likely fade after the baby is born.  A dark line from your belly button to the pubic area (linea nigra) may appear. This will likely fade  after the baby is born.  You may have changes in your hair. These can include thickening of your hair, rapid growth, and changes in texture. Some women also have hair loss during or after pregnancy, or hair that feels dry or thin. Your hair will most likely return to normal after your baby is born. WHAT TO EXPECT AT YOUR PRENATAL VISITS During a routine prenatal visit:  You will be weighed to make sure you and the fetus are growing normally.  Your blood pressure will be taken.  Your abdomen will be measured to track your baby's growth.  The fetal heartbeat will be listened to.  Any test results from the previous visit will be discussed. Your health care provider may ask you:  How you are feeling.  If you are feeling the baby move.  If you have had any abnormal symptoms, such as leaking fluid, bleeding, severe headaches, or abdominal cramping.  If you have any questions. Other tests that may be performed during your second trimester include:  Blood tests that check for:  Low iron levels (anemia).  Gestational diabetes (between 24 and 28 weeks).  Rh antibodies.  Urine tests to check for infections, diabetes, or protein in the urine.  An ultrasound to confirm the proper growth and development of the baby.  An amniocentesis to check for possible genetic problems.  Fetal screens for spina bifida and Down syndrome. HOME CARE INSTRUCTIONS   Avoid all smoking, herbs, alcohol, and unprescribed   drugs. These chemicals affect the formation and growth of the baby.  Follow your health care provider's instructions regarding medicine use. There are medicines that are either safe or unsafe to take during pregnancy.  Exercise only as directed by your health care provider. Experiencing uterine cramps is a good sign to stop exercising.  Continue to eat regular, healthy meals.  Wear a good support bra for breast tenderness.  Do not use hot tubs, steam rooms, or saunas.  Wear your  seat belt at all times when driving.  Avoid raw meat, uncooked cheese, cat litter boxes, and soil used by cats. These carry germs that can cause birth defects in the baby.  Take your prenatal vitamins.  Try taking a stool softener (if your health care provider approves) if you develop constipation. Eat more high-fiber foods, such as fresh vegetables or fruit and whole grains. Drink plenty of fluids to keep your urine clear or pale yellow.  Take warm sitz baths to soothe any pain or discomfort caused by hemorrhoids. Use hemorrhoid cream if your health care provider approves.  If you develop varicose veins, wear support hose. Elevate your feet for 15 minutes, 3-4 times a day. Limit salt in your diet.  Avoid heavy lifting, wear low heel shoes, and practice good posture.  Rest with your legs elevated if you have leg cramps or low back pain.  Visit your dentist if you have not gone yet during your pregnancy. Use a soft toothbrush to brush your teeth and be gentle when you floss.  A sexual relationship may be continued unless your health care provider directs you otherwise.  Continue to go to all your prenatal visits as directed by your health care provider. SEEK MEDICAL CARE IF:   You have dizziness.  You have mild pelvic cramps, pelvic pressure, or nagging pain in the abdominal area.  You have persistent nausea, vomiting, or diarrhea.  You have a bad smelling vaginal discharge.  You have pain with urination. SEEK IMMEDIATE MEDICAL CARE IF:   You have a fever.  You are leaking fluid from your vagina.  You have spotting or bleeding from your vagina.  You have severe abdominal cramping or pain.  You have rapid weight gain or loss.  You have shortness of breath with chest pain.  You notice sudden or extreme swelling of your face, hands, ankles, feet, or legs.  You have not felt your baby move in over an hour.  You have severe headaches that do not go away with  medicine.  You have vision changes. Document Released: 01/26/2001 Document Revised: 02/06/2013 Document Reviewed: 04/04/2012 [employer] Patient Information 2015 [employer]. This information is not intended to replace advice given to you by your health care provider. Make sure you discuss any questions you have with your health care provider.  

## 2013-08-27 NOTE — Progress Notes (Signed)
Low-risk OB appointment [id number] 1076w5d Estimated Date of Delivery: 02/20/14 BP 100/58  Wt 137 lb (62.143 kg)  LMP 05/09/2013  BP, weight, and urine reviewed.  Refer to obstetrical flow sheet for FHR.  No fm yet. Denies cramping, lof, vb, or uti s/s. Some lightheadedness when standing for too long, likely d/t physiological hypotension 2nd trimester. Reviewed prevention/relief measures.  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  Down to 5cig/day from 1ppd, last used THC this weekened. Advised cessation, discussed risks to fetus while pregnant, to infant pp, and to herself. Offered [facility], accepted, referral sent.    Reviewed warning s/s to report. Plan:  Continue routine obstetrical care  F/U in 3wks for OB appointment

## 2013-09-17 ENCOUNTER — Encounter: Payer: Self-pay | Admitting: Women's Health

## 2013-09-17 ENCOUNTER — Ambulatory Visit (INDEPENDENT_AMBULATORY_CARE_PROVIDER_SITE_OTHER): Payer: Medicaid Other | Admitting: Women's Health

## 2013-09-17 VITALS — BP 108/52 | Wt 134.0 lb

## 2013-09-17 DIAGNOSIS — Z3480 Encounter for supervision of other normal pregnancy, unspecified trimester: Secondary | ICD-10-CM

## 2013-09-17 DIAGNOSIS — Z1389 Encounter for screening for other disorder: Secondary | ICD-10-CM

## 2013-09-17 DIAGNOSIS — Z331 Pregnant state, incidental: Secondary | ICD-10-CM

## 2013-09-17 DIAGNOSIS — Z3482 Encounter for supervision of other normal pregnancy, second trimester: Secondary | ICD-10-CM

## 2013-09-17 LAB — POCT URINALYSIS DIPSTICK
Blood, UA: NEGATIVE
GLUCOSE UA: NEGATIVE
Ketones, UA: NEGATIVE
Leukocytes, UA: NEGATIVE
NITRITE UA: NEGATIVE
Protein, UA: NEGATIVE

## 2013-09-17 NOTE — Patient Instructions (Signed)
Second Trimester of Pregnancy The second trimester is from week 13 through week 28, months 4 through 6. The second trimester is often a time when you feel your best. Your body has also adjusted to being pregnant, and you begin to feel better physically. Usually, morning sickness has lessened or quit completely, you may have more energy, and you may have an increase in appetite. The second trimester is also a time when the fetus is growing rapidly. At the end of the sixth month, the fetus is about 9 inches long and weighs about 1 pounds. You will likely begin to feel the baby move (quickening) between 18 and 20 weeks of the pregnancy. BODY CHANGES Your body goes through many changes during pregnancy. The changes vary from woman to woman.   Your weight will continue to increase. You will notice your lower abdomen bulging out.  You may begin to get stretch marks on your hips, abdomen, and breasts.  You may develop headaches that can be relieved by medicines approved by your health care provider.  You may urinate more often because the fetus is pressing on your bladder.  You may develop or continue to have heartburn as a result of your pregnancy.  You may develop constipation because certain hormones are causing the muscles that push waste through your intestines to slow down.  You may develop hemorrhoids or swollen, bulging veins (varicose veins).  You may have back pain because of the weight gain and pregnancy hormones relaxing your joints between the bones in your pelvis and as a result of a shift in weight and the muscles that support your balance.  Your breasts will continue to grow and be tender.  Your gums may bleed and may be sensitive to brushing and flossing.  Dark spots or blotches (chloasma, mask of pregnancy) may develop on your face. This will likely fade after the baby is born.  A dark line from your belly button to the pubic area (linea nigra) may appear. This will likely fade  after the baby is born.  You may have changes in your hair. These can include thickening of your hair, rapid growth, and changes in texture. Some women also have hair loss during or after pregnancy, or hair that feels dry or thin. Your hair will most likely return to normal after your baby is born. WHAT TO EXPECT AT YOUR PRENATAL VISITS During a routine prenatal visit:  You will be weighed to make sure you and the fetus are growing normally.  Your blood pressure will be taken.  Your abdomen will be measured to track your baby's growth.  The fetal heartbeat will be listened to.  Any test results from the previous visit will be discussed. Your health care provider may ask you:  How you are feeling.  If you are feeling the baby move.  If you have had any abnormal symptoms, such as leaking fluid, bleeding, severe headaches, or abdominal cramping.  If you have any questions. Other tests that may be performed during your second trimester include:  Blood tests that check for:  Low iron levels (anemia).  Gestational diabetes (between 24 and 28 weeks).  Rh antibodies.  Urine tests to check for infections, diabetes, or protein in the urine.  An ultrasound to confirm the proper growth and development of the baby.  An amniocentesis to check for possible genetic problems.  Fetal screens for spina bifida and Down syndrome. HOME CARE INSTRUCTIONS   Avoid all smoking, herbs, alcohol, and unprescribed   drugs. These chemicals affect the formation and growth of the baby.  Follow your health care provider's instructions regarding medicine use. There are medicines that are either safe or unsafe to take during pregnancy.  Exercise only as directed by your health care provider. Experiencing uterine cramps is a good sign to stop exercising.  Continue to eat regular, healthy meals.  Wear a good support bra for breast tenderness.  Do not use hot tubs, steam rooms, or saunas.  Wear your  seat belt at all times when driving.  Avoid raw meat, uncooked cheese, cat litter boxes, and soil used by cats. These carry germs that can cause birth defects in the baby.  Take your prenatal vitamins.  Try taking a stool softener (if your health care provider approves) if you develop constipation. Eat more high-fiber foods, such as fresh vegetables or fruit and whole grains. Drink plenty of fluids to keep your urine clear or pale yellow.  Take warm sitz baths to soothe any pain or discomfort caused by hemorrhoids. Use hemorrhoid cream if your health care provider approves.  If you develop varicose veins, wear support hose. Elevate your feet for 15 minutes, 3-4 times a day. Limit salt in your diet.  Avoid heavy lifting, wear low heel shoes, and practice good posture.  Rest with your legs elevated if you have leg cramps or low back pain.  Visit your dentist if you have not gone yet during your pregnancy. Use a soft toothbrush to brush your teeth and be gentle when you floss.  A sexual relationship may be continued unless your health care provider directs you otherwise.  Continue to go to all your prenatal visits as directed by your health care provider. SEEK MEDICAL CARE IF:   You have dizziness.  You have mild pelvic cramps, pelvic pressure, or nagging pain in the abdominal area.  You have persistent nausea, vomiting, or diarrhea.  You have a bad smelling vaginal discharge.  You have pain with urination. SEEK IMMEDIATE MEDICAL CARE IF:   You have a fever.  You are leaking fluid from your vagina.  You have spotting or bleeding from your vagina.  You have severe abdominal cramping or pain.  You have rapid weight gain or loss.  You have shortness of breath with chest pain.  You notice sudden or extreme swelling of your face, hands, ankles, feet, or legs.  You have not felt your baby move in over an hour.  You have severe headaches that do not go away with  medicine.  You have vision changes. Document Released: 01/26/2001 Document Revised: 02/06/2013 Document Reviewed: 04/04/2012 [employer] Patient Information 2015 [employer]. This information is not intended to replace advice given to you by your health care provider. Make sure you discuss any questions you have with your health care provider.  

## 2013-09-17 NOTE — Progress Notes (Signed)
Low-risk OB appointment G3P2002 4148w5d Estimated Date of Delivery: 02/20/14 BP 108/52  Wt 134 lb (60.782 kg)  LMP 05/09/2013  BP, weight, and urine reviewed.  Refer to obstetrical flow sheet for FH & FHR.  Reports good fm.  Denies regular uc's, lof, vb, or uti s/s. Some LBP, discussed relief measures.  Wants to know if she can have a scheduled PLTCS d/t h/o PPH x 2, w/ last requiring Bakri balloon, will schedule w/ MD at next visit to discuss further.  Has stopped smoking cigarettes, still using THC but is decreasing. Praised, and advised cessation.  Reviewed ptl s/s, fm. Plan:  Continue routine obstetrical care  F/U in 2wks for OB appointment and anatomy u/s

## 2013-10-01 ENCOUNTER — Ambulatory Visit (INDEPENDENT_AMBULATORY_CARE_PROVIDER_SITE_OTHER): Payer: Medicaid Other

## 2013-10-01 ENCOUNTER — Encounter: Payer: Self-pay | Admitting: Obstetrics & Gynecology

## 2013-10-01 ENCOUNTER — Encounter: Payer: Self-pay | Admitting: Women's Health

## 2013-10-01 ENCOUNTER — Other Ambulatory Visit: Payer: Self-pay | Admitting: Women's Health

## 2013-10-01 ENCOUNTER — Ambulatory Visit (INDEPENDENT_AMBULATORY_CARE_PROVIDER_SITE_OTHER): Payer: Medicaid Other | Admitting: Obstetrics & Gynecology

## 2013-10-01 VITALS — BP 100/60 | Wt 133.0 lb

## 2013-10-01 DIAGNOSIS — Z1389 Encounter for screening for other disorder: Secondary | ICD-10-CM

## 2013-10-01 DIAGNOSIS — Z363 Encounter for antenatal screening for malformations: Secondary | ICD-10-CM

## 2013-10-01 DIAGNOSIS — Z3482 Encounter for supervision of other normal pregnancy, second trimester: Secondary | ICD-10-CM

## 2013-10-01 DIAGNOSIS — O3090 Multiple gestation, unspecified, unspecified trimester: Secondary | ICD-10-CM

## 2013-10-01 DIAGNOSIS — O09899 Supervision of other high risk pregnancies, unspecified trimester: Secondary | ICD-10-CM | POA: Insufficient documentation

## 2013-10-01 DIAGNOSIS — Q270 Congenital absence and hypoplasia of umbilical artery: Secondary | ICD-10-CM

## 2013-10-01 DIAGNOSIS — O355XX1 Maternal care for (suspected) damage to fetus by drugs, fetus 1: Secondary | ICD-10-CM

## 2013-10-01 DIAGNOSIS — O355XX0 Maternal care for (suspected) damage to fetus by drugs, not applicable or unspecified: Secondary | ICD-10-CM

## 2013-10-01 DIAGNOSIS — Z331 Pregnant state, incidental: Secondary | ICD-10-CM

## 2013-10-01 DIAGNOSIS — IMO0001 Reserved for inherently not codable concepts without codable children: Secondary | ICD-10-CM

## 2013-10-01 LAB — POCT URINALYSIS DIPSTICK
Blood, UA: NEGATIVE
GLUCOSE UA: NEGATIVE
Ketones, UA: NEGATIVE
Leukocytes, UA: NEGATIVE
Nitrite, UA: NEGATIVE
Protein, UA: NEGATIVE

## 2013-10-01 NOTE — Progress Notes (Signed)
U/S(19+5wks)-active fetus, meas c/w 18+wks, fluid wnl, posterior Gr 0 placenta, cx appears closed (3.2cm), bilateral adnexa appears WNL, FHR-153 bpm, female fetus, Lt Vent EICF noted all other cardiac structures appear NL, 2VC noted, Rt absent

## 2013-10-01 NOTE — Progress Notes (Signed)
Sonogram is reviewed and read, report done: 2 vessel cord and LV EICF   G3P2002 4546w5d Estimated Date of Delivery: 02/20/14  Blood pressure 100/60, weight 133 lb (60.328 kg), last menstrual period 05/09/2013.   BP weight and urine results all reviewed and noted.  Please refer to the obstetrical flow sheet for the fundal height and fetal heart rate documentation:  Patient reports good fetal movement, denies any bleeding and no rupture of membranes symptoms or regular contractions. Patient is without complaints. All questions were answered.  Plan:  Continued routine obstetrical care,   Follow up in 4 weeks for OB appointment, routine

## 2013-10-24 ENCOUNTER — Inpatient Hospital Stay (HOSPITAL_COMMUNITY)
Admission: AD | Admit: 2013-10-24 | Discharge: 2013-10-24 | Disposition: A | Discharge status: Home / Self Care | Payer: Medicaid Other | Source: Ambulatory Visit | Attending: Obstetrics & Gynecology | Admitting: Obstetrics & Gynecology

## 2013-10-24 ENCOUNTER — Inpatient Hospital Stay (HOSPITAL_COMMUNITY): Payer: Medicaid Other

## 2013-10-24 ENCOUNTER — Telehealth: Payer: Self-pay | Admitting: Advanced Practice Midwife

## 2013-10-24 ENCOUNTER — Encounter (HOSPITAL_COMMUNITY): Payer: Self-pay | Admitting: *Deleted

## 2013-10-24 ENCOUNTER — Encounter: Payer: Medicaid Other | Admitting: Obstetrics & Gynecology

## 2013-10-24 DIAGNOSIS — A499 Bacterial infection, unspecified: Secondary | ICD-10-CM | POA: Diagnosis not present

## 2013-10-24 DIAGNOSIS — O4700 False labor before 37 completed weeks of gestation, unspecified trimester: Secondary | ICD-10-CM | POA: Insufficient documentation

## 2013-10-24 DIAGNOSIS — O9989 Other specified diseases and conditions complicating pregnancy, childbirth and the puerperium: Secondary | ICD-10-CM

## 2013-10-24 DIAGNOSIS — R109 Unspecified abdominal pain: Secondary | ICD-10-CM | POA: Insufficient documentation

## 2013-10-24 DIAGNOSIS — N760 Acute vaginitis: Secondary | ICD-10-CM | POA: Insufficient documentation

## 2013-10-24 DIAGNOSIS — O2390 Unspecified genitourinary tract infection in pregnancy, unspecified trimester: Secondary | ICD-10-CM | POA: Diagnosis not present

## 2013-10-24 DIAGNOSIS — B9689 Other specified bacterial agents as the cause of diseases classified elsewhere: Secondary | ICD-10-CM | POA: Diagnosis not present

## 2013-10-24 DIAGNOSIS — Z87891 Personal history of nicotine dependence: Secondary | ICD-10-CM | POA: Insufficient documentation

## 2013-10-24 LAB — WET PREP, GENITAL
Trich, Wet Prep: NONE SEEN
Yeast Wet Prep HPF POC: NONE SEEN

## 2013-10-24 LAB — FETAL FIBRONECTIN: FETAL FIBRONECTIN: POSITIVE — AB

## 2013-10-24 LAB — OB RESULTS CONSOLE GC/CHLAMYDIA
Chlamydia: NEGATIVE
Gonorrhea: NEGATIVE

## 2013-10-24 MED ORDER — NIFEDIPINE 10 MG PO CAPS: 10.0000 mg | ORAL_CAPSULE | ORAL | Status: DC | PRN

## 2013-10-24 MED ORDER — METRONIDAZOLE 500 MG PO TABS
500.0000 mg | ORAL_TABLET | Freq: Two times a day (BID) | ORAL | Status: DC
Start: 2013-10-24 — End: 2013-10-24

## 2013-10-24 MED ORDER — METRONIDAZOLE 500 MG PO TABS: 500.0000 mg | ORAL_TABLET | Freq: Two times a day (BID) | ORAL | Status: DC

## 2013-10-24 NOTE — MAU Note (Signed)
Patient states she has been having some lower abdominal cramping and arrived by EMS. Denies bleeding or leaking and reports feeling fetal movement.

## 2013-10-24 NOTE — MAU Note (Signed)
Pt states she has abdominal pains that feel like cramps not contractions. Pt states she vomited x1 this AM. Pt states vomit had blood in it.

## 2013-10-24 NOTE — Discharge Instructions (Signed)
Bacterial Vaginosis Bacterial vaginosis is an infection of the vagina. It happens when too many of certain germs (bacteria) grow in the vagina. HOME CARE  Take your medicine as told by your doctor.  Finish your medicine even if you start to feel better.  Do not have sex until you finish your medicine and are better.  Tell your sex partner that you have an infection. They should see their doctor for treatment.  Practice safe sex. Use condoms. Have only one sex partner. GET HELP IF:  You are not getting better after 3 days of treatment.  You have more grey fluid (discharge) coming from your vagina than before.  You have more pain than before.  You have a fever. MAKE SURE YOU:   Understand these instructions.  Will watch your condition.  Will get help right away if you are not doing well or get worse. Document Released: 11/11/2007 Document Revised: 11/22/2012 Document Reviewed: 09/13/2012 [facility] Patient Information 2015 [place], [place]. This information is not intended to replace advice given to you by your health care provider. Make sure you discuss any questions you have with your health care provider.   Preterm Labor Information Preterm labor is when labor starts before you are [redacted] weeks pregnant. The normal length of pregnancy is 39 to 41 weeks.  CAUSES  The cause of preterm labor is not often known. The most common known cause is infection. RISK FACTORS  Having a history of preterm labor.  Having your water break before it should.  Having a placenta that covers the opening of the cervix.  Having a placenta that breaks away from the uterus.  Having a cervix that is too weak to hold the baby in the uterus.  Having too much fluid in the amniotic sac.  Taking drugs or smoking while pregnant.  Not gaining enough weight while pregnant.  Being younger than 57 and older than 25 years old.  Having a low income.  Being African American. SYMPTOMS  Period-like  cramps, belly (abdominal) pain, or back pain.  Contractions that are regular, as often as six in an hour. They may be mild or painful.  Contractions that start at the top of the belly. They then move to the lower belly and back.  Lower belly pressure that seems to get stronger.  Bleeding from the vagina.  Fluid leaking from the vagina. TREATMENT  Treatment depends on:  Your condition.  The condition of your baby.  How many weeks pregnant you are. Your doctor may have you:  Take medicine to stop contractions.  Stay in bed except to use the restroom (bed rest).  Stay in the hospital. WHAT SHOULD YOU DO IF YOU THINK YOU ARE IN PRETERM LABOR? Call your doctor right away. You need to go to the hospital right away.  HOW CAN YOU PREVENT PRETERM LABOR IN FUTURE PREGNANCIES?  Stop smoking, if you smoke.  Maintain healthy weight gain.  Do not take drugs or be around chemicals that are not needed.  Tell your doctor if you think you have an infection.  Tell your doctor if you had a preterm labor before. Document Released: 04/30/2008 Document Revised: 11/22/2012 Document Reviewed: 04/30/2008 [facility] Patient Information 2015 [place], [place]. This information is not intended to replace advice given to you by your health care provider. Make sure you discuss any questions you have with your health care provider.

## 2013-10-24 NOTE — Telephone Encounter (Signed)
Pt states having real bad cramping all day, no relief with Tylenol.  Was vomiting this morning and noted some blood in it, this has happened about 3 - 4 times but more blood this morning than past few times.  Cramping started this morning and is getting worse, she has not been timing the pain but it has been coming and going all day, she has tried getting up and walking around but has a lot of pressure when she does that, reports good FM and no vaginal bleeding.  Advised pt we will work her in to see someone for the pain and pressure.  Call transferred to front staff for appointment to be made.

## 2013-10-24 NOTE — MAU Provider Note (Signed)
  History     CSN: [id number]  Arrival date and time: 10/24/13 1551   None     Chief Complaint  Patient presents with  . Abdominal Pain   Abdominal Pain Pertinent negatives include no diarrhea, dysuria, fever, frequency, hematuria, nausea or vomiting.   Patient is 23 y.o. [id number] [redacted]w[redacted]d.  +FM, denies LOF, VB  Started feeling regular cramping starting this morning, was at work and cramps/back pain was worse with ambulating.  Did not want to come to MAU but appeared in such distress that work called EMS. - no dysuria, no frequency - last intercourse was 48 hours ago, no vaginal bleeding  Past Medical History  Diagnosis Date  . Medical history non-contributory   . Supervision of other normal pregnancy 07/30/2013    Past Surgical History  Procedure Laterality Date  . No past surgeries      Family History  Problem Relation Age of Onset  . Seizures Other   . Hypertension Other   . Diabetes Other   . Diabetes Father   . Hypertension Maternal Grandmother   . Diabetes Paternal Grandfather     History  Substance Use Topics  . Smoking status: Former Smoker -- 0.25 packs/day for 8 years    Types: Cigarettes  . Smokeless tobacco: Never Used  . Alcohol Use: No     Comment: social; not now    Allergies: No Known Allergies  Prescriptions prior to admission  Medication Sig Dispense Refill  . acetaminophen (TYLENOL) 500 MG tablet Take 1,000 mg by mouth every 6 (six) hours as needed.      . Pediatric Multivit-Minerals-C (FLINTSTONES GUMMIES PO) Take 2 tablets by mouth daily.         Review of Systems  Constitutional: Negative for fever and chills.  Respiratory: Negative for cough and shortness of breath.   Cardiovascular: Negative for chest pain and leg swelling.  Gastrointestinal: Positive for abdominal pain. Negative for heartburn, nausea, vomiting and diarrhea.  Genitourinary: Negative for dysuria, urgency, frequency and hematuria.  Neurological:       No headache    Physical Exam   Blood pressure 105/42, temperature 97.7 F (36.5 C), resp. rate 16, last menstrual period 05/09/2013.  Physical Exam  Constitutional: She is oriented to person, place, and time. She appears well-developed and well-nourished.  HENT:  Head: Normocephalic and atraumatic.  Eyes: Conjunctivae and EOM are normal.  Neck: Normal range of motion.  Cardiovascular: Normal rate.   Respiratory: Effort normal. No respiratory distress.  GI: Soft. She exhibits no distension. There is no tenderness.  Genitourinary:  Cervical os visualized with speculum, +vaginal discharge, no blood  Musculoskeletal: Normal range of motion. She exhibits no edema.  Neurological: She is alert and oriented to person, place, and time.  Skin: Skin is warm and dry. No erythema.    MAU Course  Procedures  MDM FFN: positive Transvaginal CL: 3.2cm Cervical exam: closed/thick/high (by me) Wet prep: few clue/few WBC G/C   Assessment and Plan  FFN positive, discussed with Dr. Emelda Fear ==> f/u in clinic FT on Friday, eval need for betamethasone BV: will treat with flagyl  BID x 7 days Preterm labor precautions discussed, overall work up has been reassuring with the exception of the positive FFN Uterine irritability noted initially on toco  Arelie Kuzel ROCIO 10/24/2013, 5:05 PM

## 2013-10-25 LAB — GC/CHLAMYDIA PROBE AMP
CT Probe RNA: NEGATIVE
GC PROBE AMP APTIMA: NEGATIVE

## 2013-10-26 ENCOUNTER — Telehealth: Payer: Self-pay | Admitting: Obstetrics and Gynecology

## 2013-10-26 ENCOUNTER — Ambulatory Visit (INDEPENDENT_AMBULATORY_CARE_PROVIDER_SITE_OTHER): Payer: Medicaid Other | Admitting: Obstetrics and Gynecology

## 2013-10-26 VITALS — BP 90/56

## 2013-10-26 DIAGNOSIS — Z3482 Encounter for supervision of other normal pregnancy, second trimester: Secondary | ICD-10-CM

## 2013-10-26 DIAGNOSIS — Z1389 Encounter for screening for other disorder: Secondary | ICD-10-CM

## 2013-10-26 DIAGNOSIS — B9689 Other specified bacterial agents as the cause of diseases classified elsewhere: Secondary | ICD-10-CM | POA: Insufficient documentation

## 2013-10-26 DIAGNOSIS — O09899 Supervision of other high risk pregnancies, unspecified trimester: Secondary | ICD-10-CM

## 2013-10-26 DIAGNOSIS — O09292 Supervision of pregnancy with other poor reproductive or obstetric history, second trimester: Secondary | ICD-10-CM

## 2013-10-26 DIAGNOSIS — O2390 Unspecified genitourinary tract infection in pregnancy, unspecified trimester: Secondary | ICD-10-CM

## 2013-10-26 DIAGNOSIS — N760 Acute vaginitis: Secondary | ICD-10-CM

## 2013-10-26 LAB — POCT URINALYSIS DIPSTICK
GLUCOSE UA: NEGATIVE
Ketones, UA: NEGATIVE
LEUKOCYTES UA: NEGATIVE
Nitrite, UA: NEGATIVE

## 2013-10-26 MED ORDER — METRONIDAZOLE 500 MG PO TABS: 500.0000 mg | ORAL_TABLET | Freq: Two times a day (BID) | ORAL | Status: DC

## 2013-10-26 NOTE — Progress Notes (Signed)
High Risk Pregnancy Diagnosis(es):   + FFN at 23 weeks G3P2002 [redacted]w[redacted]d Estimated Date of Delivery: 02/20/14  Blood pressure 90/56, last menstrual period 05/09/2013.  Seen in [facility] 2 days ago, received + FFN Urinalysis: trace of protein otherwise negative.  HPI: Pt states that she is having a lot of cramping and pressure in her lower stomach and lower back. Pt states that is hurts worse when she walks around. Pt was seen in the ED for the same symptoms on the 9th. The pt states the symptoms have not gotten any better, but she also states that she has not gotten her medication yet either. She states that she smokes marijuana every other day and has stopped smoking cigarettes.   BP weight and urine results all reviewed and noted. Patient reports good fetal movement, denies any bleeding and no rupture of membranes symptoms or regular contractions.  Physical Examination:  WET PREP: KOH positive, negative yeast.   Fundal Height:  Not indicated Fetal Heart rate:  Not indicated Edema:  negative  Patient is without complaints. All questions were answered.  Assessment:  [redacted]w[redacted]d, BV, positive FFN from 10/24/13 ED visit  Medication(s) Plans:  Began metronidazole Rx from the hospital today Treatment Plan:  Began metronidazole and BMZ next week.  Follow up in Tuesday and Wednesday weeks for BMZ   This chart was scribed by Chestine Spore, Medical Scribe, for Dr. Christin Bach on 10/26/13 at 12:15 PM. This chart was reviewed by Dr. Christin Bach for accuracy.

## 2013-10-26 NOTE — Addendum Note (Signed)
Addended by: Tilda Burrow on: 10/26/2013 01:15 PM   Modules accepted: Orders

## 2013-10-29 ENCOUNTER — Encounter: Payer: Medicaid Other | Admitting: Women's Health

## 2013-10-29 NOTE — MAU Provider Note (Signed)
Attestation of Attending Supervision of Advanced Practitioner: Evaluation and management procedures were performed by the PA/NP/CNM/OB Fellow under my supervision/collaboration. Chart reviewed and agree with management and plan.  Tilda Burrow 10/29/2013 6:29 PM

## 2013-10-30 ENCOUNTER — Ambulatory Visit (INDEPENDENT_AMBULATORY_CARE_PROVIDER_SITE_OTHER): Payer: Medicaid Other | Admitting: Women's Health

## 2013-10-30 ENCOUNTER — Encounter: Payer: Self-pay | Admitting: Women's Health

## 2013-10-30 ENCOUNTER — Encounter: Payer: Medicaid Other | Admitting: Women's Health

## 2013-10-30 VITALS — BP 94/52 | Wt 133.0 lb

## 2013-10-30 DIAGNOSIS — O4702 False labor before 37 completed weeks of gestation, second trimester: Secondary | ICD-10-CM

## 2013-10-30 DIAGNOSIS — Z1389 Encounter for screening for other disorder: Secondary | ICD-10-CM

## 2013-10-30 DIAGNOSIS — O479 False labor, unspecified: Secondary | ICD-10-CM | POA: Insufficient documentation

## 2013-10-30 DIAGNOSIS — Z3482 Encounter for supervision of other normal pregnancy, second trimester: Secondary | ICD-10-CM

## 2013-10-30 DIAGNOSIS — O4700 False labor before 37 completed weeks of gestation, unspecified trimester: Secondary | ICD-10-CM

## 2013-10-30 DIAGNOSIS — O09899 Supervision of other high risk pregnancies, unspecified trimester: Secondary | ICD-10-CM

## 2013-10-30 DIAGNOSIS — Z331 Pregnant state, incidental: Secondary | ICD-10-CM

## 2013-10-30 DIAGNOSIS — O283 Abnormal ultrasonic finding on antenatal screening of mother: Secondary | ICD-10-CM | POA: Insufficient documentation

## 2013-10-30 DIAGNOSIS — F1290 Cannabis use, unspecified, uncomplicated: Secondary | ICD-10-CM

## 2013-10-30 DIAGNOSIS — R8789 Other abnormal findings in specimens from female genital organs: Secondary | ICD-10-CM

## 2013-10-30 LAB — POCT URINALYSIS DIPSTICK
Blood, UA: NEGATIVE
GLUCOSE UA: NEGATIVE
NITRITE UA: NEGATIVE
Protein, UA: NEGATIVE

## 2013-10-30 MED ORDER — BETAMETHASONE SOD PHOS & ACET 6 (3-3) MG/ML IJ SUSP
12.0000 mg | Freq: Once | INTRAMUSCULAR | Status: AC
  Administered 2013-10-30: 12 mg via INTRAMUSCULAR

## 2013-10-30 NOTE — Progress Notes (Signed)
High Risk Pregnancy Diagnosis(es): preterm contractions, +fFN @ 23wks G3P2002 [redacted]w[redacted]d Estimated Date of Delivery: 02/20/14 BP 94/52  Wt 133 lb (60.328 kg)  LMP 05/09/2013  Urinalysis: Positive for mod ketones, trace wbc's HPI:  No change since last evaluated, still has some cramping/pressure, not any worse. Still smoking THC, last time was yesterday- states it's b/c of nausea, advised cessation, discussed long-term risks to fetus, offered anti-emetic which she declined, aware SW will be involved at hospital if UDS's continue to be + BP, weight, and urine reviewed.  Reports good fm. Denies regular uc's, lof, vb, uti s/s.   Fundal Height:  23 Fetal Heart rate:  159 Edema:  none  Received 1st dose of BMZ today Reviewed ptl s/s, fm. To increase fluids a lot- enough so that urine is clear, based on urine today she is dehydrated and may likely be contributing to cramping All questions were answered Assessment: [redacted]w[redacted]d +fFN, 2VC, Lt EICF, THC use Medication(s) Plans:  1st BMZ today Treatment Plan:  Return tomorrow for 2nd BMZ Follow up in 1d for 2nd BMZ, then in 4wks for high-risk OB appt w/ f/u u/s to evaluate EICF and growth d/t 2VC, and pn2

## 2013-10-30 NOTE — Patient Instructions (Signed)
You will have your sugar test next visit.  Please do not eat or drink anything after midnight the night before you come, not even water.  You will be here for at least two hours.      Circumcision: $507 at hospital, $244 at [facility], has to be paid up front before it is done. If you want the circumcision done at [facility] you can make payments during pregnancy. If you are interested in this, see receptionist at check-out.  If your baby is older than 28 days when you have the circumcision done at [facility], the fee will go up to $325.50.    Preterm Labor Information Preterm labor is when labor starts at less than 37 weeks of pregnancy. The normal length of a pregnancy is 39 to 41 weeks. CAUSES Often, there is no identifiable underlying cause as to why a woman goes into preterm labor. One of the most common known causes of preterm labor is infection. Infections of the uterus, cervix, vagina, amniotic sac, bladder, kidney, or even the lungs (pneumonia) can cause labor to start. Other suspected causes of preterm labor include:   Urogenital infections, such as yeast infections and bacterial vaginosis.   Uterine abnormalities (uterine shape, uterine septum, fibroids, or bleeding from the placenta).   A cervix that has been operated on (it may fail to stay closed).   Malformations in the fetus.   Multiple gestations (twins, triplets, and so on).   Breakage of the amniotic sac.  RISK FACTORS  Having a previous history of preterm labor.   Having premature rupture of membranes (PROM).   Having a placenta that covers the opening of the cervix (placenta previa).   Having a placenta that separates from the uterus (placental abruption).   Having a cervix that is too weak to hold the fetus in the uterus (incompetent cervix).   Having too much fluid in the amniotic sac (polyhydramnios).   Taking illegal drugs or smoking while pregnant.   Not gaining enough weight while  pregnant.   Being younger than 6 and older than 25 years old.   Having a low socioeconomic status.   Being African American. SYMPTOMS Signs and symptoms of preterm labor include:   Menstrual-like cramps, abdominal pain, or back pain.  Uterine contractions that are regular, as frequent as six in an hour, regardless of their intensity (may be mild or painful).  Contractions that start on the top of the uterus and spread down to the lower abdomen and back.   A sense of increased pelvic pressure.   A watery or bloody mucus discharge that comes from the vagina.  TREATMENT Depending on the length of the pregnancy and other circumstances, your health care provider may suggest bed rest. If necessary, there are medicines that can be given to stop contractions and to mature the fetal lungs. If labor happens before 34 weeks of pregnancy, a prolonged hospital stay may be recommended. Treatment depends on the condition of both you and the fetus.  WHAT SHOULD YOU DO IF YOU THINK YOU ARE IN PRETERM LABOR? Call your health care provider right away. You will need to go to the hospital to get checked immediately. HOW CAN YOU PREVENT PRETERM LABOR IN FUTURE PREGNANCIES? You should:   Stop smoking if you smoke.  Maintain healthy weight gain and avoid chemicals and drugs that are not necessary.  Be watchful for any type of infection.  Inform your health care provider if you have a known history  of preterm labor. Document Released: 04/24/2003 Document Revised: 10/04/2012 Document Reviewed: 03/06/2012 [facility] Patient Information 2015 [place], [place]. This information is not intended to replace advice given to you by your health care provider. Make sure you discuss any questions you have with your health care provider.

## 2013-10-30 NOTE — Telephone Encounter (Signed)
I spoke with pt and asked if she got the Rx from last week. Pt stated that she had picked the Rx up and is taking it.

## 2013-10-31 ENCOUNTER — Encounter: Payer: Self-pay | Admitting: Adult Health

## 2013-10-31 ENCOUNTER — Ambulatory Visit (INDEPENDENT_AMBULATORY_CARE_PROVIDER_SITE_OTHER): Payer: Medicaid Other | Admitting: Adult Health

## 2013-10-31 ENCOUNTER — Encounter: Payer: Self-pay | Admitting: Women's Health

## 2013-10-31 VITALS — BP 96/56 | Ht 60.0 in | Wt 133.5 lb

## 2013-10-31 DIAGNOSIS — O4702 False labor before 37 completed weeks of gestation, second trimester: Secondary | ICD-10-CM

## 2013-10-31 DIAGNOSIS — Z331 Pregnant state, incidental: Secondary | ICD-10-CM

## 2013-10-31 DIAGNOSIS — O4700 False labor before 37 completed weeks of gestation, unspecified trimester: Secondary | ICD-10-CM

## 2013-10-31 DIAGNOSIS — Z1389 Encounter for screening for other disorder: Secondary | ICD-10-CM

## 2013-10-31 LAB — DRUG SCREEN, URINE, NO CONFIRMATION
Amphetamine Screen, Ur: NEGATIVE
Barbiturate Quant, Ur: NEGATIVE
Benzodiazepines.: NEGATIVE
CREATININE, U: 211.4 mg/dL
Cocaine Metabolites: NEGATIVE
Marijuana Metabolite: POSITIVE — AB
Methadone: NEGATIVE
Opiate Screen, Urine: NEGATIVE
PHENCYCLIDINE (PCP): NEGATIVE
PROPOXYPHENE: NEGATIVE

## 2013-10-31 LAB — POCT URINALYSIS DIPSTICK
Glucose, UA: NEGATIVE
NITRITE UA: NEGATIVE
Protein, UA: NEGATIVE

## 2013-10-31 MED ORDER — BETAMETHASONE SOD PHOS & ACET 6 (3-3) MG/ML IJ SUSP
12.0000 mg | Freq: Once | INTRAMUSCULAR | Status: AC
  Administered 2013-10-31: 12 mg via INTRAMUSCULAR

## 2013-11-27 ENCOUNTER — Other Ambulatory Visit: Payer: Medicaid Other

## 2013-11-27 ENCOUNTER — Other Ambulatory Visit: Payer: Self-pay | Admitting: Obstetrics & Gynecology

## 2013-11-27 ENCOUNTER — Ambulatory Visit (INDEPENDENT_AMBULATORY_CARE_PROVIDER_SITE_OTHER): Payer: Medicaid Other

## 2013-11-27 ENCOUNTER — Other Ambulatory Visit: Payer: Self-pay | Admitting: Women's Health

## 2013-11-27 ENCOUNTER — Encounter: Payer: Self-pay | Admitting: Obstetrics & Gynecology

## 2013-11-27 ENCOUNTER — Ambulatory Visit (INDEPENDENT_AMBULATORY_CARE_PROVIDER_SITE_OTHER): Payer: Medicaid Other | Admitting: Obstetrics & Gynecology

## 2013-11-27 VITALS — BP 90/60 | Wt 136.0 lb

## 2013-11-27 DIAGNOSIS — O0992 Supervision of high risk pregnancy, unspecified, second trimester: Secondary | ICD-10-CM

## 2013-11-27 DIAGNOSIS — Q270 Congenital absence and hypoplasia of umbilical artery: Secondary | ICD-10-CM

## 2013-11-27 DIAGNOSIS — O283 Abnormal ultrasonic finding on antenatal screening of mother: Secondary | ICD-10-CM

## 2013-11-27 DIAGNOSIS — Z113 Encounter for screening for infections with a predominantly sexual mode of transmission: Secondary | ICD-10-CM

## 2013-11-27 DIAGNOSIS — O35BXX1 Maternal care for other (suspected) fetal abnormality and damage, fetal cardiac anomalies, fetus 1: Secondary | ICD-10-CM

## 2013-11-27 DIAGNOSIS — O358XX1 Maternal care for other (suspected) fetal abnormality and damage, fetus 1: Secondary | ICD-10-CM

## 2013-11-27 DIAGNOSIS — O365921 Maternal care for other known or suspected poor fetal growth, second trimester, fetus 1: Secondary | ICD-10-CM

## 2013-11-27 DIAGNOSIS — Z1389 Encounter for screening for other disorder: Secondary | ICD-10-CM

## 2013-11-27 DIAGNOSIS — Z131 Encounter for screening for diabetes mellitus: Secondary | ICD-10-CM

## 2013-11-27 DIAGNOSIS — Z3482 Encounter for supervision of other normal pregnancy, second trimester: Secondary | ICD-10-CM

## 2013-11-27 DIAGNOSIS — Z331 Pregnant state, incidental: Secondary | ICD-10-CM

## 2013-11-27 DIAGNOSIS — IMO0002 Reserved for concepts with insufficient information to code with codable children: Secondary | ICD-10-CM

## 2013-11-27 DIAGNOSIS — Z114 Encounter for screening for human immunodeficiency virus [HIV]: Secondary | ICD-10-CM

## 2013-11-27 DIAGNOSIS — Z0184 Encounter for antibody response examination: Secondary | ICD-10-CM

## 2013-11-27 DIAGNOSIS — O0993 Supervision of high risk pregnancy, unspecified, third trimester: Secondary | ICD-10-CM

## 2013-11-27 LAB — CBC
HCT: 32.6 % — ABNORMAL LOW (ref 36.0–46.0)
Hemoglobin: 10.9 g/dL — ABNORMAL LOW (ref 12.0–15.0)
MCH: 28.6 pg (ref 26.0–34.0)
MCHC: 33.4 g/dL (ref 30.0–36.0)
MCV: 85.6 fL (ref 78.0–100.0)
PLATELETS: 278 10*3/uL (ref 150–400)
RBC: 3.81 MIL/uL — ABNORMAL LOW (ref 3.87–5.11)
RDW: 14.8 % (ref 11.5–15.5)
WBC: 8.4 10*3/uL (ref 4.0–10.5)

## 2013-11-27 NOTE — Progress Notes (Signed)
Sonogram reveal 14%tile with other parameters normal, 2 VC and LV EICF persists:  Will start twice weekly surveillance@32weeks  sono alt NST  High Risk Pregnancy Diagnosis(es):   IUGR, 2 vc.   [id number] 1668w6d Estimated Date of Delivery: 02/20/14  Blood pressure 90/60, weight 136 lb (61.689 kg), last menstrual period 05/09/2013.  Urinalysis: Negative   HPI: As above     BP weight and urine results all reviewed and noted. Patient reports good fetal movement, denies any bleeding and no rupture of membranes symptoms or regular contractions.  Fundal Height:  27 Fetal Heart rate:  137 Edema:  none  Patient is without complaints. All questions were answered.  Assessment:  3668w6d,   IUGR, 2 vessel cord, LV EICF  Medication(s) Plans:  No changes  Treatment Plan:  As above  Follow up in 3 weeks for OB appt,

## 2013-11-27 NOTE — Progress Notes (Signed)
U/S(27+6wks)-vtx active fetus, FHR-137 bpm, fluid WNL, posterior Gr 1 placenta, cx appears closed (3.5cm), bilateral adnexa appears WNL, female fetus, 2VC noted (Rt absent), Lt Vent EICF remains although NL appearing 4 chamber and OFT's noted again today, EFW 1 lb 11 oz (14th%tile), FL lags >[redacted] wks GA,  HL and AC lags >[redacted] wks GA, UA Doppler performed RI-0.64 & 0.63 S/D ratio 2.78 & 2.69

## 2013-11-27 NOTE — Addendum Note (Signed)
Addended by: Gaylyn RongEVANS, Aanyah Loa A on: 11/27/2013 10:28 AM   Modules accepted: Orders

## 2013-11-28 LAB — ANTIBODY SCREEN: ANTIBODY SCREEN: NEGATIVE

## 2013-11-28 LAB — HSV 2 ANTIBODY, IGG: HSV 2 Glycoprotein G Ab, IgG: 12.09 IV — ABNORMAL HIGH

## 2013-11-28 LAB — HIV ANTIBODY (ROUTINE TESTING W REFLEX): HIV 1&2 Ab, 4th Generation: NONREACTIVE

## 2013-11-28 LAB — RPR

## 2013-11-29 ENCOUNTER — Encounter: Payer: Self-pay | Admitting: Obstetrics & Gynecology

## 2013-11-30 ENCOUNTER — Other Ambulatory Visit: Payer: Medicaid Other

## 2013-11-30 DIAGNOSIS — Z131 Encounter for screening for diabetes mellitus: Secondary | ICD-10-CM

## 2013-12-01 LAB — GLUCOSE TOLERANCE, 2 HOURS W/ 1HR
GLUCOSE, 2 HOUR: 77 mg/dL (ref 70–139)
Glucose, 1 hour: 65 mg/dL — ABNORMAL LOW (ref 70–170)
Glucose, Fasting: 75 mg/dL (ref 70–99)

## 2013-12-03 ENCOUNTER — Telehealth: Payer: Self-pay | Admitting: *Deleted

## 2013-12-03 NOTE — Telephone Encounter (Signed)
Pt called stating that she noticed some spotting this afternoon.  She reports that she had intercourse last night.  Pt states she is not having any cramping, no loss of fluids, no heavy bleeding or passing clots and reports good fetal movement.  Advised pt it can be normal to have spotting after sex, advised her to push fluids, rest.   Advised pt [facility] call us back [facility] if develops heavy bleeding, severe cramping or sudden gush of fluids.  Pt verbalized understanding.

## 2013-12-17 ENCOUNTER — Encounter: Payer: Self-pay | Admitting: Obstetrics & Gynecology

## 2013-12-18 ENCOUNTER — Ambulatory Visit (INDEPENDENT_AMBULATORY_CARE_PROVIDER_SITE_OTHER): Payer: Medicaid Other | Admitting: Obstetrics & Gynecology

## 2013-12-18 VITALS — BP 102/52 | Wt 139.0 lb

## 2013-12-18 DIAGNOSIS — O0993 Supervision of high risk pregnancy, unspecified, third trimester: Secondary | ICD-10-CM

## 2013-12-18 DIAGNOSIS — Z1389 Encounter for screening for other disorder: Secondary | ICD-10-CM

## 2013-12-18 DIAGNOSIS — Z331 Pregnant state, incidental: Secondary | ICD-10-CM

## 2013-12-18 DIAGNOSIS — O365921 Maternal care for other known or suspected poor fetal growth, second trimester, fetus 1: Secondary | ICD-10-CM

## 2013-12-18 DIAGNOSIS — IMO0002 Reserved for concepts with insufficient information to code with codable children: Secondary | ICD-10-CM

## 2013-12-18 DIAGNOSIS — Q270 Congenital absence and hypoplasia of umbilical artery: Secondary | ICD-10-CM

## 2013-12-18 LAB — POCT URINALYSIS DIPSTICK
Blood, UA: NEGATIVE
GLUCOSE UA: NEGATIVE
Ketones, UA: NEGATIVE
LEUKOCYTES UA: NEGATIVE
Nitrite, UA: NEGATIVE
Protein, UA: NEGATIVE

## 2013-12-18 NOTE — Progress Notes (Signed)
High Risk Pregnancy Diagnosis(es):   2VC with IUGR  G3P2002 10654w6d Estimated Date of Delivery: 02/20/14  Blood pressure 102/52, weight 139 lb (63.05 kg), last menstrual period 05/09/2013.  Urinalysis: Negative   HPI: No problems or complaints     BP weight and urine results all reviewed and noted. Patient reports good fetal movement, denies any bleeding and no rupture of membranes symptoms or regular contractions.  Fundal Height:  30  Fetal Heart rate:  142 Edema:  none  Patient is without complaints. All questions were answered.  Assessment:  5854w6d,   2VC, IUGR  Medication(s) Plans:  No changes  Treatment Plan:  twice weekly assessments sonogram + NST  Follow up in 1 weeks for OB appt, sonogram

## 2013-12-25 ENCOUNTER — Other Ambulatory Visit: Payer: Self-pay | Admitting: Obstetrics & Gynecology

## 2013-12-25 ENCOUNTER — Ambulatory Visit (INDEPENDENT_AMBULATORY_CARE_PROVIDER_SITE_OTHER): Payer: Medicaid Other | Admitting: Obstetrics & Gynecology

## 2013-12-25 ENCOUNTER — Ambulatory Visit (INDEPENDENT_AMBULATORY_CARE_PROVIDER_SITE_OTHER): Payer: Medicaid Other

## 2013-12-25 ENCOUNTER — Other Ambulatory Visit: Payer: Self-pay | Admitting: Obstetrics and Gynecology

## 2013-12-25 ENCOUNTER — Encounter: Payer: Self-pay | Admitting: Obstetrics & Gynecology

## 2013-12-25 VITALS — BP 100/70 | Wt 137.0 lb

## 2013-12-25 DIAGNOSIS — O358XX1 Maternal care for other (suspected) fetal abnormality and damage, fetus 1: Secondary | ICD-10-CM

## 2013-12-25 DIAGNOSIS — O35BXX1 Maternal care for other (suspected) fetal abnormality and damage, fetal cardiac anomalies, fetus 1: Secondary | ICD-10-CM

## 2013-12-25 DIAGNOSIS — Z1389 Encounter for screening for other disorder: Secondary | ICD-10-CM

## 2013-12-25 DIAGNOSIS — O365931 Maternal care for other known or suspected poor fetal growth, third trimester, fetus 1: Secondary | ICD-10-CM

## 2013-12-25 DIAGNOSIS — O365921 Maternal care for other known or suspected poor fetal growth, second trimester, fetus 1: Secondary | ICD-10-CM

## 2013-12-25 DIAGNOSIS — IMO0002 Reserved for concepts with insufficient information to code with codable children: Secondary | ICD-10-CM

## 2013-12-25 DIAGNOSIS — Q270 Congenital absence and hypoplasia of umbilical artery: Secondary | ICD-10-CM

## 2013-12-25 DIAGNOSIS — O0993 Supervision of high risk pregnancy, unspecified, third trimester: Secondary | ICD-10-CM

## 2013-12-25 DIAGNOSIS — Z331 Pregnant state, incidental: Secondary | ICD-10-CM

## 2013-12-25 LAB — POCT URINALYSIS DIPSTICK
Glucose, UA: NEGATIVE
Ketones, UA: NEGATIVE
Leukocytes, UA: NEGATIVE
NITRITE UA: NEGATIVE
Protein, UA: NEGATIVE
RBC UA: NEGATIVE

## 2013-12-25 LAB — US OB FOLLOW UP

## 2013-12-25 NOTE — Progress Notes (Signed)
sono <3%tile with other parameters normal   High Risk Pregnancy Diagnosis(es):   IUGR  G3P2002 8356w6d Estimated Date of Delivery: 02/20/14  Blood pressure 100/70, weight 137 lb (62.143 kg), last menstrual period 05/09/2013.  Urinalysis: Negative   HPI: No com,plaints     BP weight and urine results all reviewed and noted. Patient reports good fetal movement, denies any bleeding and no rupture of membranes symptoms or regular contractions.  Fundal Height:  30 Fetal Heart rate:  144 Edema:  none  Patient is without complaints. All questions were answered.  Assessment:  7856w6d,   IUGR  Medication(s) Plans:  No changes  Treatment Plan:  Twice weeklu survillance, induce at 37-38 weeks assumming normal twice weekly evals  Follow up in Friday weeks for OB appt, NST

## 2013-12-25 NOTE — Progress Notes (Signed)
U/S(31+6wks)- vtx active fetus, BPP 8/8, fluid WNL AFI-13.3cm SDP-3.9cm, FHR-144 bpm, female fetus, UA Doppler RI-0.57 & 0.52, 2VC (Rt absent), Lt Vent EICF remains, EFw 2 lb11 oz (3rd%tile), posterior Gr 2 placenta

## 2013-12-28 ENCOUNTER — Ambulatory Visit (INDEPENDENT_AMBULATORY_CARE_PROVIDER_SITE_OTHER): Payer: Medicaid Other | Admitting: Obstetrics & Gynecology

## 2013-12-28 VITALS — BP 94/60 | Wt 136.5 lb

## 2013-12-28 DIAGNOSIS — Z1389 Encounter for screening for other disorder: Secondary | ICD-10-CM

## 2013-12-28 DIAGNOSIS — Q270 Congenital absence and hypoplasia of umbilical artery: Secondary | ICD-10-CM

## 2013-12-28 DIAGNOSIS — Z331 Pregnant state, incidental: Secondary | ICD-10-CM

## 2013-12-28 DIAGNOSIS — O283 Abnormal ultrasonic finding on antenatal screening of mother: Secondary | ICD-10-CM

## 2013-12-28 DIAGNOSIS — O365921 Maternal care for other known or suspected poor fetal growth, second trimester, fetus 1: Secondary | ICD-10-CM

## 2013-12-28 DIAGNOSIS — IMO0002 Reserved for concepts with insufficient information to code with codable children: Secondary | ICD-10-CM

## 2013-12-28 DIAGNOSIS — O0993 Supervision of high risk pregnancy, unspecified, third trimester: Secondary | ICD-10-CM

## 2013-12-28 LAB — POCT URINALYSIS DIPSTICK
Glucose, UA: NEGATIVE
Leukocytes, UA: NEGATIVE
Nitrite, UA: NEGATIVE
Protein, UA: NEGATIVE
RBC UA: NEGATIVE

## 2013-12-28 NOTE — Progress Notes (Signed)
Reactive NST   High Risk Pregnancy Diagnosis(es):   IUGR<3%tile, 2VC, LV EICF  G3P2002 710w2d Estimated Date of Delivery: 02/20/14  Blood pressure 94/60, weight 136 lb 8 oz (61.916 kg), last menstrual period 05/09/2013.  Urinalysis: Negative   HPI: No complaints     BP weight and urine results all reviewed and noted. Patient reports good fetal movement, denies any bleeding and no rupture of membranes symptoms or regular contractions.  Fundal Height:  30 Fetal Heart rate:  140 Edema:  none  Patient is without complaints. All questions were answered.  Assessment:  5710w2d,   IUGR. 2VC, LV EICF  Medication(s) Plans:  No changes  Treatment Plan:  Twice weekly assessment, sono alt NST  Follow up in tuesday weeks for OB appt, sonogram

## 2014-01-01 ENCOUNTER — Other Ambulatory Visit: Payer: Self-pay | Admitting: Obstetrics & Gynecology

## 2014-01-01 ENCOUNTER — Encounter: Payer: Self-pay | Admitting: Obstetrics & Gynecology

## 2014-01-01 ENCOUNTER — Ambulatory Visit (INDEPENDENT_AMBULATORY_CARE_PROVIDER_SITE_OTHER): Payer: Medicaid Other

## 2014-01-01 ENCOUNTER — Ambulatory Visit (INDEPENDENT_AMBULATORY_CARE_PROVIDER_SITE_OTHER): Payer: Medicaid Other | Admitting: Obstetrics & Gynecology

## 2014-01-01 VITALS — BP 100/60 | Wt 138.0 lb

## 2014-01-01 DIAGNOSIS — O283 Abnormal ultrasonic finding on antenatal screening of mother: Secondary | ICD-10-CM

## 2014-01-01 DIAGNOSIS — Z331 Pregnant state, incidental: Secondary | ICD-10-CM

## 2014-01-01 DIAGNOSIS — IMO0002 Reserved for concepts with insufficient information to code with codable children: Secondary | ICD-10-CM

## 2014-01-01 DIAGNOSIS — Z1389 Encounter for screening for other disorder: Secondary | ICD-10-CM

## 2014-01-01 DIAGNOSIS — O0993 Supervision of high risk pregnancy, unspecified, third trimester: Secondary | ICD-10-CM

## 2014-01-01 NOTE — Progress Notes (Signed)
U/S(32+6wks)-vtx active fetus, FHR-138 bpm, fluid WNL AFI-11.0cm SDP-3.4cm, posterior Gr 1 placenta, BPP 8/8, UA Doppler RI-0.56 & 0.52, 2VC, Lt Vent EICF

## 2014-01-01 NOTE — Progress Notes (Signed)
Sonogram reveals reasuring fetal status  High Risk Pregnancy Diagnosis(es):   IUGR  G3P2002 6036w6d Estimated Date of Delivery: 02/20/14  Blood pressure 100/60, weight 138 lb (62.596 kg), last menstrual period 05/09/2013.  Urinalysis: Negative   HPI: No complaints     BP weight and urine results all reviewed and noted. Patient reports good fetal movement, denies any bleeding and no rupture of membranes symptoms or regular contractions.  Fundal Height:  30 Fetal Heart rate:  138 Edema:  no  Patient is without complaints. All questions were answered.  Assessment:  1836w6d,   IUGR, 2VC, EICF  Medication(s) Plans:  No changes  Treatment Plan:  Twice weekloy surveillance  Follow up in Friday weeks for OB appt, NST

## 2014-01-02 ENCOUNTER — Encounter: Payer: Self-pay | Admitting: Advanced Practice Midwife

## 2014-01-03 NOTE — Addendum Note (Signed)
Addended by: Richardson ChiquitoRAVIS, Jailin Moomaw M on: 01/03/2014 04:30 PM   Modules accepted: Orders

## 2014-01-04 ENCOUNTER — Ambulatory Visit (INDEPENDENT_AMBULATORY_CARE_PROVIDER_SITE_OTHER): Payer: Medicaid Other | Admitting: Obstetrics & Gynecology

## 2014-01-04 ENCOUNTER — Encounter: Payer: Self-pay | Admitting: Obstetrics & Gynecology

## 2014-01-04 VITALS — BP 100/60 | Wt 138.0 lb

## 2014-01-04 DIAGNOSIS — Z331 Pregnant state, incidental: Secondary | ICD-10-CM

## 2014-01-04 DIAGNOSIS — O0993 Supervision of high risk pregnancy, unspecified, third trimester: Secondary | ICD-10-CM

## 2014-01-04 DIAGNOSIS — IMO0002 Reserved for concepts with insufficient information to code with codable children: Secondary | ICD-10-CM

## 2014-01-04 DIAGNOSIS — Z1389 Encounter for screening for other disorder: Secondary | ICD-10-CM

## 2014-01-04 LAB — POCT URINALYSIS DIPSTICK
GLUCOSE UA: NEGATIVE
Ketones, UA: NEGATIVE
Leukocytes, UA: NEGATIVE
NITRITE UA: NEGATIVE
Protein, UA: NEGATIVE
RBC UA: NEGATIVE

## 2014-01-04 NOTE — Progress Notes (Signed)
Reactive NST   High Risk Pregnancy Diagnosis(es):   IUGR, 2VC  G3P2002 8036w2d Estimated Date of Delivery: 02/20/14  Blood pressure 100/60, weight 138 lb (62.596 kg), last menstrual period 05/09/2013.  Urinalysis: Negative   HPI: No complaints     BP weight and urine results all reviewed and noted. Patient reports good fetal movement, denies any bleeding and no rupture of membranes symptoms or regular contractions.  Fundal Height:  32 Fetal Heart rate:  140 Edema:  trace  Patient is without complaints. All questions were answered.  Assessment:  536w2d,   IUGR<3%, 2VC  Medication(s) Plans:  No changes  Treatment Plan:  Continue twice weekly assessments/testing with planned induction at 37 weeks if all goes well  Follow up in wednesday weeks for OB appt, sonogram see dr Despina Hiddeneure

## 2014-01-09 ENCOUNTER — Other Ambulatory Visit: Payer: Self-pay | Admitting: Obstetrics & Gynecology

## 2014-01-09 ENCOUNTER — Ambulatory Visit (INDEPENDENT_AMBULATORY_CARE_PROVIDER_SITE_OTHER): Payer: Medicaid Other | Admitting: Obstetrics & Gynecology

## 2014-01-09 ENCOUNTER — Ambulatory Visit (INDEPENDENT_AMBULATORY_CARE_PROVIDER_SITE_OTHER): Payer: Medicaid Other

## 2014-01-09 VITALS — BP 90/62 | Wt 138.5 lb

## 2014-01-09 DIAGNOSIS — IMO0002 Reserved for concepts with insufficient information to code with codable children: Secondary | ICD-10-CM

## 2014-01-09 DIAGNOSIS — Z331 Pregnant state, incidental: Secondary | ICD-10-CM

## 2014-01-09 DIAGNOSIS — Z1389 Encounter for screening for other disorder: Secondary | ICD-10-CM

## 2014-01-09 DIAGNOSIS — O0993 Supervision of high risk pregnancy, unspecified, third trimester: Secondary | ICD-10-CM

## 2014-01-09 DIAGNOSIS — O365931 Maternal care for other known or suspected poor fetal growth, third trimester, fetus 1: Secondary | ICD-10-CM

## 2014-01-09 LAB — US OB FOLLOW UP

## 2014-01-09 LAB — POCT URINALYSIS DIPSTICK
GLUCOSE UA: NEGATIVE
Ketones, UA: NEGATIVE
Leukocytes, UA: NEGATIVE
NITRITE UA: NEGATIVE
Protein, UA: NEGATIVE
RBC UA: NEGATIVE

## 2014-01-09 NOTE — Progress Notes (Signed)
Sonogram is reassuring in an IUGR fetus, no associated complicating factors   High Risk Pregnancy Diagnosis(es):   IUGR without associated complicating factors  G3P2002 4879w0d Estimated Date of Delivery: 02/20/14  Blood pressure 90/62, weight 138 lb 8 oz (62.823 kg), last menstrual period 05/09/2013.  Urinalysis: Negative   HPI: No complaints     BP weight and urine results all reviewed and noted. Patient reports good fetal movement, denies any bleeding and no rupture of membranes symptoms or regular contractions.  Fundal Height:  30 Fetal Heart rate:  143 Edema:  none  Patient is without complaints. All questions were answered.  Assessment:  879w0d,   IUGR  Medication(s) Plans:  No changes  Treatment Plan:  Twice weekly assessment with induction at 37 weeks  Follow up in Monday weeks for OB appt, NST

## 2014-01-09 NOTE — Progress Notes (Signed)
U/S(34+0wks)-vtx active fetus, EFW 3 lb 5 oz (<3rd%tile), fluid WNL AFI-8.7cm SDP-5.5cm, FHR-143 bpm, female fetus, BPP 8/8, UA Doppler RI-0.60 & 0.50, posterior Gr 2 placenta

## 2014-01-14 ENCOUNTER — Encounter: Payer: Self-pay | Admitting: Women's Health

## 2014-01-14 ENCOUNTER — Ambulatory Visit (INDEPENDENT_AMBULATORY_CARE_PROVIDER_SITE_OTHER): Payer: Medicaid Other | Admitting: Women's Health

## 2014-01-14 VITALS — BP 98/60 | Wt 142.0 lb

## 2014-01-14 DIAGNOSIS — IMO0002 Reserved for concepts with insufficient information to code with codable children: Secondary | ICD-10-CM

## 2014-01-14 DIAGNOSIS — Z1389 Encounter for screening for other disorder: Secondary | ICD-10-CM

## 2014-01-14 DIAGNOSIS — O0993 Supervision of high risk pregnancy, unspecified, third trimester: Secondary | ICD-10-CM

## 2014-01-14 DIAGNOSIS — Z331 Pregnant state, incidental: Secondary | ICD-10-CM

## 2014-01-14 DIAGNOSIS — O365931 Maternal care for other known or suspected poor fetal growth, third trimester, fetus 1: Secondary | ICD-10-CM

## 2014-01-14 DIAGNOSIS — O283 Abnormal ultrasonic finding on antenatal screening of mother: Secondary | ICD-10-CM

## 2014-01-14 LAB — POCT URINALYSIS DIPSTICK
Blood, UA: NEGATIVE
Glucose, UA: NEGATIVE
Ketones, UA: NEGATIVE
Leukocytes, UA: NEGATIVE
Nitrite, UA: NEGATIVE
PROTEIN UA: NEGATIVE

## 2014-01-14 NOTE — Progress Notes (Addendum)
High Risk Pregnancy Diagnosis(es): IUGR, 2VC, Lt EICF G3P2002 4980w5d Estimated Date of Delivery: 02/20/14 BP 98/60 mmHg  Wt 142 lb (64.411 kg)  LMP 05/09/2013  Urinalysis: Negative HPI:  Low back pain/pressure x few days. Has gained 4lbs since last visit, only 7lb total pregnancy- states her appetite is finally picking up some BP, weight, and urine reviewed.  Reports good fm. Denies regular uc's, lof, vb, uti s/s.   Fundal Height:  32 Fetal Heart rate:  140, reactive NST Edema:  trace  Reviewed ptl s/s, fkc. Discussed back pain relief measures.  All questions were answered Assessment: 4380w5d IUGR, 2VC, Lt EICF Medication(s) Plans:  n/a Treatment Plan:  Continue 2x/wk testing, IOL @ 37wks Follow up in 3d for high-risk OB appt and bpp/dopp u/s

## 2014-01-14 NOTE — Patient Instructions (Signed)
Call the office ([phone]) or go to [facility] if:  You begin to have strong, frequent contractions  Your water breaks.  Sometimes it is a big gush of fluid, sometimes it is just a trickle that keeps getting your panties wet or running down your legs  You have vaginal bleeding.  It is normal to have a small amount of spotting if your cervix was checked.   You don't feel your baby moving like normal.  If you don't, get you something to eat and drink and lay down and focus on feeling your baby move.  You should feel at least 10 movements in 2 hours.  If you don't, you should call the office or go to [facility].    Preterm Labor Information Preterm labor is when labor starts at less than 37 weeks of pregnancy. The normal length of a pregnancy is 39 to 41 weeks. CAUSES Often, there is no identifiable underlying cause as to why a woman goes into preterm labor. One of the most common known causes of preterm labor is infection. Infections of the uterus, cervix, vagina, amniotic sac, bladder, kidney, or even the lungs (pneumonia) can cause labor to start. Other suspected causes of preterm labor include:   Urogenital infections, such as yeast infections and bacterial vaginosis.   Uterine abnormalities (uterine shape, uterine septum, fibroids, or bleeding from the placenta).   A cervix that has been operated on (it may fail to stay closed).   Malformations in the fetus.   Multiple gestations (twins, triplets, and so on).   Breakage of the amniotic sac.  RISK FACTORS  Having a previous history of preterm labor.   Having premature rupture of membranes (PROM).   Having a placenta that covers the opening of the cervix (placenta previa).   Having a placenta that separates from the uterus (placental abruption).   Having a cervix that is too weak to hold the fetus in the uterus (incompetent cervix).   Having too much fluid in the amniotic sac (polyhydramnios).   Taking  illegal drugs or smoking while pregnant.   Not gaining enough weight while pregnant.   Being younger than 18 and older than 25 years old.   Having a low socioeconomic status.   Being African American. SYMPTOMS Signs and symptoms of preterm labor include:   Menstrual-like cramps, abdominal pain, or back pain.  Uterine contractions that are regular, as frequent as six in an hour, regardless of their intensity (may be mild or painful).  Contractions that start on the top of the uterus and spread down to the lower abdomen and back.   A sense of increased pelvic pressure.   A watery or bloody mucus discharge that comes from the vagina.  TREATMENT Depending on the length of the pregnancy and other circumstances, your health care provider may suggest bed rest. If necessary, there are medicines that can be given to stop contractions and to mature the fetal lungs. If labor happens before 34 weeks of pregnancy, a prolonged hospital stay may be recommended. Treatment depends on the condition of both you and the fetus.  WHAT SHOULD YOU DO IF YOU THINK YOU ARE IN PRETERM LABOR? Call your health care provider right away. You will need to go to the hospital to get checked immediately. HOW CAN YOU PREVENT PRETERM LABOR IN FUTURE PREGNANCIES? You should:   Stop smoking if you smoke.  Maintain healthy weight gain and avoid chemicals and drugs that are not necessary.  Be watchful for   any type of infection.  Inform your health care provider if you have a known history of preterm labor. Document Released: 04/24/2003 Document Revised: 10/04/2012 Document Reviewed: 03/06/2012 Exit[employer] Patient Information 2015 [employer]. This information is not intended to replace advice given to you by your health care provider. Make sure you discuss any questions you have with your health care provider.  

## 2014-01-17 ENCOUNTER — Ambulatory Visit (INDEPENDENT_AMBULATORY_CARE_PROVIDER_SITE_OTHER): Payer: Medicaid Other | Admitting: Advanced Practice Midwife

## 2014-01-17 ENCOUNTER — Other Ambulatory Visit: Payer: Self-pay | Admitting: Women's Health

## 2014-01-17 ENCOUNTER — Ambulatory Visit (INDEPENDENT_AMBULATORY_CARE_PROVIDER_SITE_OTHER): Payer: Medicaid Other

## 2014-01-17 VITALS — BP 110/70 | Wt 139.0 lb

## 2014-01-17 DIAGNOSIS — IMO0002 Reserved for concepts with insufficient information to code with codable children: Secondary | ICD-10-CM

## 2014-01-17 DIAGNOSIS — Z331 Pregnant state, incidental: Secondary | ICD-10-CM

## 2014-01-17 DIAGNOSIS — O365931 Maternal care for other known or suspected poor fetal growth, third trimester, fetus 1: Secondary | ICD-10-CM

## 2014-01-17 DIAGNOSIS — O0993 Supervision of high risk pregnancy, unspecified, third trimester: Secondary | ICD-10-CM

## 2014-01-17 DIAGNOSIS — Z1389 Encounter for screening for other disorder: Secondary | ICD-10-CM

## 2014-01-17 LAB — POCT URINALYSIS DIPSTICK
Blood, UA: NEGATIVE
Glucose, UA: NEGATIVE
Ketones, UA: NEGATIVE
LEUKOCYTES UA: NEGATIVE
Nitrite, UA: NEGATIVE
PROTEIN UA: NEGATIVE

## 2014-01-17 NOTE — Progress Notes (Signed)
U/S(35+1wks)-vtx active fetus, BPP 8/8, fluid WNL AFI-11.2cmSDP-5.9cm, posterior Gr 2 placenta, UA Doppler RI-0.62 & 0.61, FHR-138 bpm

## 2014-01-17 NOTE — Progress Notes (Signed)
High Risk Pregnancy Diagnosis(es):   IUGR/2VC  [id number] 3158w1d Estimated Date of Delivery: 02/20/14  Blood pressure 110/70, weight 139 lb (63.05 kg), last menstrual period 05/09/2013.  Urinalysis: Not examined   HPI: C/O cough for 2 days. No other sx     BP weight and urine results all reviewed and noted. US today:  U/S(35+1wks)-vtx active fetus, BPP 8/8, fluid WNL AFI-11.2cmSDP-5.9cm, posterior Gr 2 placenta, UA Doppler RI-0.62 & 0.61, FHR-138 bpm Patient reports good fetal movement, denies any bleeding and no rupture of membranes symptoms or regular contractiions  All questions were answered.  Assessment:  958w1d,   IUGR/2VC  Medication(s) Plans: Guidelines for when ABX are appropriate discussed.  OTC meds for cough  Treatment Plan:  Continue Monday NST and Thursday US surveillence

## 2014-01-21 ENCOUNTER — Ambulatory Visit (INDEPENDENT_AMBULATORY_CARE_PROVIDER_SITE_OTHER): Payer: Medicaid Other | Admitting: Obstetrics and Gynecology

## 2014-01-21 ENCOUNTER — Encounter: Payer: Self-pay | Admitting: Obstetrics and Gynecology

## 2014-01-21 VITALS — BP 110/70 | Wt 141.0 lb

## 2014-01-21 DIAGNOSIS — Z1389 Encounter for screening for other disorder: Secondary | ICD-10-CM

## 2014-01-21 DIAGNOSIS — Z331 Pregnant state, incidental: Secondary | ICD-10-CM

## 2014-01-21 DIAGNOSIS — O365931 Maternal care for other known or suspected poor fetal growth, third trimester, fetus 1: Secondary | ICD-10-CM

## 2014-01-21 DIAGNOSIS — O0993 Supervision of high risk pregnancy, unspecified, third trimester: Secondary | ICD-10-CM

## 2014-01-21 LAB — POCT URINALYSIS DIPSTICK
Glucose, UA: NEGATIVE
Ketones, UA: NEGATIVE
LEUKOCYTES UA: NEGATIVE
NITRITE UA: NEGATIVE
PROTEIN UA: NEGATIVE
RBC UA: NEGATIVE

## 2014-01-21 NOTE — Progress Notes (Signed)
Pt denies any problems or concerns at this time.  

## 2014-01-21 NOTE — Progress Notes (Signed)
High Risk Pregnancy Diagnosis(es):1826w5d    IUGR <3%ile, 2VC, hx PPH x 2. Stable Left ventricle EICF  G3P2002 4126w5d Estimated Date of Delivery: 02/20/14  Blood pressure 110/70, weight 141 lb (63.957 kg), last menstrual period 05/09/2013.  Urinalysis: Negative   HPI:     Good FM. NST Reactive. Having occasional contraction.  BP weight and urine results all reviewed and noted.BP 110/70 mmHg  Wt 141 lb (63.957 kg)  LMP 05/09/2013 Patient reports good fetal movement, denies any bleeding and no rupture of membranes symptoms or regular contractions.  Fundal Height:  33 Fetal Heart rate:  150 Edema:  neg  Patient is without complaints. All questions were answered.  Assessment:  9826w5d,   2VC, IUGR<3%ile with good dopplers q wk, Nst Qwk, reactive today  Medication(s) Plans:  none  Treatment Plan:  IOL at 37+wk  Follow up in 0.5 weeks for OB appt, BPP

## 2014-01-22 ENCOUNTER — Other Ambulatory Visit: Payer: Self-pay | Admitting: Obstetrics & Gynecology

## 2014-01-22 DIAGNOSIS — F1910 Other psychoactive substance abuse, uncomplicated: Secondary | ICD-10-CM

## 2014-01-22 DIAGNOSIS — Q270 Congenital absence and hypoplasia of umbilical artery: Secondary | ICD-10-CM

## 2014-01-22 DIAGNOSIS — O99320 Drug use complicating pregnancy, unspecified trimester: Secondary | ICD-10-CM

## 2014-01-22 DIAGNOSIS — O365931 Maternal care for other known or suspected poor fetal growth, third trimester, fetus 1: Secondary | ICD-10-CM

## 2014-01-24 ENCOUNTER — Ambulatory Visit (INDEPENDENT_AMBULATORY_CARE_PROVIDER_SITE_OTHER): Payer: Medicaid Other

## 2014-01-24 ENCOUNTER — Ambulatory Visit (INDEPENDENT_AMBULATORY_CARE_PROVIDER_SITE_OTHER): Payer: Medicaid Other | Admitting: Obstetrics and Gynecology

## 2014-01-24 ENCOUNTER — Other Ambulatory Visit: Payer: Self-pay | Admitting: Obstetrics & Gynecology

## 2014-01-24 ENCOUNTER — Encounter: Payer: Self-pay | Admitting: Obstetrics and Gynecology

## 2014-01-24 VITALS — BP 110/70 | Wt 142.0 lb

## 2014-01-24 DIAGNOSIS — O0993 Supervision of high risk pregnancy, unspecified, third trimester: Secondary | ICD-10-CM

## 2014-01-24 DIAGNOSIS — Z1389 Encounter for screening for other disorder: Secondary | ICD-10-CM

## 2014-01-24 DIAGNOSIS — O365931 Maternal care for other known or suspected poor fetal growth, third trimester, fetus 1: Secondary | ICD-10-CM

## 2014-01-24 DIAGNOSIS — O99320 Drug use complicating pregnancy, unspecified trimester: Secondary | ICD-10-CM

## 2014-01-24 DIAGNOSIS — IMO0002 Reserved for concepts with insufficient information to code with codable children: Secondary | ICD-10-CM

## 2014-01-24 DIAGNOSIS — Z331 Pregnant state, incidental: Secondary | ICD-10-CM

## 2014-01-24 DIAGNOSIS — Q270 Congenital absence and hypoplasia of umbilical artery: Secondary | ICD-10-CM

## 2014-01-24 DIAGNOSIS — F1910 Other psychoactive substance abuse, uncomplicated: Secondary | ICD-10-CM

## 2014-01-24 LAB — POCT URINALYSIS DIPSTICK
GLUCOSE UA: NEGATIVE
Ketones, UA: NEGATIVE
Leukocytes, UA: NEGATIVE
Nitrite, UA: NEGATIVE
Protein, UA: NEGATIVE
RBC UA: NEGATIVE

## 2014-01-24 MED ORDER — HYDROCODONE-HOMATROPINE 5-1.5 MG/5ML PO SYRP: 5.0000 mL | ORAL_SOLUTION | Freq: Four times a day (QID) | ORAL | Status: DC | PRN

## 2014-01-24 NOTE — Progress Notes (Signed)
Rx hycodan for nonproductive cough x 1 wk without fever or productive sputum.

## 2014-01-24 NOTE — Progress Notes (Signed)
U/S(36+1wks)-vtx active fetus BPP 6/8 no respiration movement noted, fluid WNL AFI-16.1cm, SDP-4.9cm, posterior Gr 3 placenta, EFw 4 lb 1 oz (<3rd%tile), UA Doppler RI-0.53, 2VC noted

## 2014-01-24 NOTE — Addendum Note (Signed)
Addended by: Tilda BurrowFERGUSON, Yarah Fuente V on: 01/24/2014 06:09 PM   Modules accepted: Orders

## 2014-01-24 NOTE — Progress Notes (Signed)
Patient seen for BPP for 2 vc and SGA < 3%le, with normal dopplers.  BPP is 6/8 so NST performed: NST reactive so BPP is 8/10.  Pt reports good FM. NO contractions. A Stable pregnancy, 2 VC, SGA, @ 5283w1d P continue biweekly testing, IOL at 37+-39wk or at onset of nonreassuring evaluation.

## 2014-01-24 NOTE — Progress Notes (Signed)
Pt denies any problems or concerns at this time.  

## 2014-01-28 ENCOUNTER — Ambulatory Visit (INDEPENDENT_AMBULATORY_CARE_PROVIDER_SITE_OTHER): Payer: Medicaid Other | Admitting: Obstetrics and Gynecology

## 2014-01-28 ENCOUNTER — Encounter: Payer: Self-pay | Admitting: Obstetrics and Gynecology

## 2014-01-28 VITALS — BP 90/60 | Wt 143.0 lb

## 2014-01-28 DIAGNOSIS — O0993 Supervision of high risk pregnancy, unspecified, third trimester: Secondary | ICD-10-CM

## 2014-01-28 DIAGNOSIS — Z1389 Encounter for screening for other disorder: Secondary | ICD-10-CM

## 2014-01-28 DIAGNOSIS — Z308 Encounter for other contraceptive management: Secondary | ICD-10-CM

## 2014-01-28 DIAGNOSIS — IMO0001 Reserved for inherently not codable concepts without codable children: Secondary | ICD-10-CM | POA: Insufficient documentation

## 2014-01-28 DIAGNOSIS — Z3685 Encounter for antenatal screening for Streptococcus B: Secondary | ICD-10-CM

## 2014-01-28 DIAGNOSIS — Z1159 Encounter for screening for other viral diseases: Secondary | ICD-10-CM

## 2014-01-28 DIAGNOSIS — Z331 Pregnant state, incidental: Secondary | ICD-10-CM

## 2014-01-28 DIAGNOSIS — Z118 Encounter for screening for other infectious and parasitic diseases: Secondary | ICD-10-CM

## 2014-01-28 DIAGNOSIS — O365931 Maternal care for other known or suspected poor fetal growth, third trimester, fetus 1: Secondary | ICD-10-CM

## 2014-01-28 DIAGNOSIS — IMO0002 Reserved for concepts with insufficient information to code with codable children: Secondary | ICD-10-CM

## 2014-01-28 NOTE — Progress Notes (Addendum)
High Risk Pregnancy Diagnosis(es):   SGA infant, 2 VC. Being followed with biweekly testing. [id number] 2839w5d Estimated Date of Delivery: 02/20/14   Blood pressure 90/60, weight 64.864 kg (143 lb), last menstrual period 05/09/2013.  Urinalysis: Negative GBS collected  HPI: Good FM.  BP weight and urine results all reviewed and noted. Patient reports good fetal movement, denies any bleeding and no rupture of membranes symptoms or regular contractions.  Fundal Height:  34 Fetal Heart rate:  145 dema:  -- NST reactive Last week's u/s for EFW 4.lb1 oz AFI 16, BPP  Patient is without complaints. All questions were answered.  Assessment:  8839w5d,   SGA(<3%ile), 2VC, left ventricle EICF, small and stable. Medication(s) Plans:  none Treatment Plan:  IOL at 37+ wk scheduled for Thursday 12/17 at 7 a.m  Follow up in 4  weeks for OB appt, pp visit                Medicaid Tubal ligation papers signed today.

## 2014-01-29 ENCOUNTER — Telehealth (HOSPITAL_COMMUNITY): Payer: Self-pay | Admitting: *Deleted

## 2014-01-29 LAB — GC/CHLAMYDIA PROBE AMP
CT PROBE, AMP APTIMA: NEGATIVE
GC Probe RNA: NEGATIVE

## 2014-01-29 NOTE — Addendum Note (Signed)
Addended by: Richardson ChiquitoRAVIS, Svetlana Bagby M on: 01/29/2014 12:11 PM   Modules accepted: Orders

## 2014-01-29 NOTE — Telephone Encounter (Signed)
Preadmission screen  

## 2014-01-30 LAB — US OB FOLLOW UP

## 2014-01-30 LAB — STREP B DNA PROBE: GBSP: DETECTED

## 2014-01-31 ENCOUNTER — Encounter (HOSPITAL_COMMUNITY): Payer: Self-pay

## 2014-01-31 ENCOUNTER — Inpatient Hospital Stay (HOSPITAL_COMMUNITY): Payer: Medicaid Other | Admitting: Anesthesiology

## 2014-01-31 ENCOUNTER — Inpatient Hospital Stay (HOSPITAL_COMMUNITY)
Admission: RE | Admit: 2014-01-31 | Discharge: 2014-02-02 | DRG: 775 | Disposition: A | Discharge status: Home / Self Care | Payer: Medicaid Other | Source: Ambulatory Visit | Attending: Obstetrics and Gynecology | Admitting: Obstetrics and Gynecology

## 2014-01-31 VITALS — BP 104/63 | HR 71 | Temp 98.3°F | Resp 20 | Ht 60.0 in | Wt 143.0 lb

## 2014-01-31 DIAGNOSIS — Z833 Family history of diabetes mellitus: Secondary | ICD-10-CM | POA: Diagnosis not present

## 2014-01-31 DIAGNOSIS — O365930 Maternal care for other known or suspected poor fetal growth, third trimester, not applicable or unspecified: Secondary | ICD-10-CM | POA: Diagnosis present

## 2014-01-31 DIAGNOSIS — F1290 Cannabis use, unspecified, uncomplicated: Secondary | ICD-10-CM | POA: Diagnosis present

## 2014-01-31 DIAGNOSIS — Z3A37 37 weeks gestation of pregnancy: Secondary | ICD-10-CM | POA: Diagnosis present

## 2014-01-31 DIAGNOSIS — O09292 Supervision of pregnancy with other poor reproductive or obstetric history, second trimester: Secondary | ICD-10-CM

## 2014-01-31 DIAGNOSIS — Z87891 Personal history of nicotine dependence: Secondary | ICD-10-CM | POA: Diagnosis not present

## 2014-01-31 DIAGNOSIS — O283 Abnormal ultrasonic finding on antenatal screening of mother: Secondary | ICD-10-CM

## 2014-01-31 DIAGNOSIS — O99824 Streptococcus B carrier state complicating childbirth: Secondary | ICD-10-CM

## 2014-01-31 DIAGNOSIS — O99323 Drug use complicating pregnancy, third trimester: Secondary | ICD-10-CM | POA: Diagnosis present

## 2014-01-31 DIAGNOSIS — Z3490 Encounter for supervision of normal pregnancy, unspecified, unspecified trimester: Secondary | ICD-10-CM

## 2014-01-31 DIAGNOSIS — O0993 Supervision of high risk pregnancy, unspecified, third trimester: Secondary | ICD-10-CM

## 2014-01-31 LAB — TYPE AND SCREEN
ABO/RH(D): O POS
Antibody Screen: NEGATIVE

## 2014-01-31 LAB — CBC
HCT: 31.6 % — ABNORMAL LOW (ref 36.0–46.0)
Hemoglobin: 10.7 g/dL — ABNORMAL LOW (ref 12.0–15.0)
MCH: 29 pg (ref 26.0–34.0)
MCHC: 33.9 g/dL (ref 30.0–36.0)
MCV: 85.6 fL (ref 78.0–100.0)
PLATELETS: 311 10*3/uL (ref 150–400)
RBC: 3.69 MIL/uL — AB (ref 3.87–5.11)
RDW: 14 % (ref 11.5–15.5)
WBC: 9.6 10*3/uL (ref 4.0–10.5)

## 2014-01-31 LAB — HIV ANTIBODY (ROUTINE TESTING W REFLEX): HIV 1&2 Ab, 4th Generation: NONREACTIVE

## 2014-01-31 LAB — RPR

## 2014-01-31 MED ORDER — LACTATED RINGERS IV SOLN
500.0000 mL | Freq: Once | INTRAVENOUS | Status: AC
  Administered 2014-01-31: 500 mL via INTRAVENOUS

## 2014-01-31 MED ORDER — OXYCODONE-ACETAMINOPHEN 5-325 MG PO TABS
1.0000 | ORAL_TABLET | ORAL | Status: DC | PRN
  Administered 2014-01-31: 1 via ORAL
  Filled 2014-01-31: qty 1

## 2014-01-31 MED ORDER — TERBUTALINE SULFATE 1 MG/ML IJ SOLN: 0.2500 mg | Freq: Once | INTRAMUSCULAR | Status: AC | PRN

## 2014-01-31 MED ORDER — PHENYLEPHRINE 40 MCG/ML (10ML) SYRINGE FOR IV PUSH (FOR BLOOD PRESSURE SUPPORT)
80.0000 ug | PREFILLED_SYRINGE | INTRAVENOUS | Status: DC | PRN
  Filled 2014-01-31: qty 2

## 2014-01-31 MED ORDER — FENTANYL 2.5 MCG/ML BUPIVACAINE 1/10 % EPIDURAL INFUSION (WH - ANES)
14.0000 mL/h | INTRAMUSCULAR | Status: DC | PRN
  Administered 2014-01-31: 14 mL/h via EPIDURAL
  Filled 2014-01-31: qty 125

## 2014-01-31 MED ORDER — IBUPROFEN 600 MG PO TABS
600.0000 mg | ORAL_TABLET | Freq: Four times a day (QID) | ORAL | Status: DC
  Administered 2014-01-31 – 2014-02-02 (×7): 600 mg via ORAL
  Filled 2014-01-31 (×7): qty 1

## 2014-01-31 MED ORDER — LACTATED RINGERS IV SOLN
500.0000 mL | INTRAVENOUS | Status: DC | PRN
  Administered 2014-01-31: 500 mL via INTRAVENOUS

## 2014-01-31 MED ORDER — ONDANSETRON HCL 4 MG/2ML IJ SOLN
4.0000 mg | Freq: Four times a day (QID) | INTRAMUSCULAR | Status: DC | PRN
  Administered 2014-01-31: 4 mg via INTRAVENOUS
  Filled 2014-01-31: qty 2

## 2014-01-31 MED ORDER — ACETAMINOPHEN 325 MG PO TABS
650.0000 mg | ORAL_TABLET | ORAL | Status: DC | PRN
Start: 2014-01-31 — End: 2014-02-01

## 2014-01-31 MED ORDER — OXYTOCIN 40 UNITS IN LACTATED RINGERS INFUSION - SIMPLE MED: 62.5000 mL/h | INTRAVENOUS | Status: DC

## 2014-01-31 MED ORDER — PENICILLIN G POTASSIUM 5000000 UNITS IJ SOLR
2.5000 10*6.[IU] | INTRAMUSCULAR | Status: DC
  Administered 2014-01-31 (×2): 2.5 10*6.[IU] via INTRAVENOUS
  Filled 2014-01-31 (×7): qty 2.5

## 2014-01-31 MED ORDER — LACTATED RINGERS IV SOLN
INTRAVENOUS | Status: DC
  Administered 2014-01-31: 22:00:00 via INTRAVENOUS

## 2014-01-31 MED ORDER — MISOPROSTOL 25 MCG QUARTER TABLET: 25.0000 ug | ORAL_TABLET | ORAL | Status: DC | PRN

## 2014-01-31 MED ORDER — EPHEDRINE 5 MG/ML INJ
10.0000 mg | INTRAVENOUS | Status: DC | PRN
Start: 2014-01-31 — End: 2014-02-01
  Filled 2014-01-31: qty 2

## 2014-01-31 MED ORDER — PHENYLEPHRINE 40 MCG/ML (10ML) SYRINGE FOR IV PUSH (FOR BLOOD PRESSURE SUPPORT)
80.0000 ug | PREFILLED_SYRINGE | INTRAVENOUS | Status: DC | PRN
  Filled 2014-01-31: qty 10
  Filled 2014-01-31: qty 2

## 2014-01-31 MED ORDER — PENICILLIN G POTASSIUM 5000000 UNITS IJ SOLR
5.0000 10*6.[IU] | Freq: Once | INTRAVENOUS | Status: AC
  Administered 2014-01-31: 5 10*6.[IU] via INTRAVENOUS
  Filled 2014-01-31: qty 5

## 2014-01-31 MED ORDER — OXYTOCIN BOLUS FROM INFUSION
500.0000 mL | INTRAVENOUS | Status: DC
  Administered 2014-01-31: 500 mL via INTRAVENOUS

## 2014-01-31 MED ORDER — CITRIC ACID-SODIUM CITRATE 334-500 MG/5ML PO SOLN: 30.0000 mL | ORAL | Status: DC | PRN

## 2014-01-31 MED ORDER — EPHEDRINE 5 MG/ML INJ
10.0000 mg | INTRAVENOUS | Status: DC | PRN
  Filled 2014-01-31: qty 2

## 2014-01-31 MED ORDER — LIDOCAINE HCL (PF) 1 % IJ SOLN
INTRAMUSCULAR | Status: DC | PRN
  Administered 2014-01-31 (×2): 8 mL

## 2014-01-31 MED ORDER — DIPHENHYDRAMINE HCL 50 MG/ML IJ SOLN: 12.5000 mg | INTRAMUSCULAR | Status: DC | PRN

## 2014-01-31 MED ORDER — OXYCODONE-ACETAMINOPHEN 5-325 MG PO TABS: 2.0000 | ORAL_TABLET | ORAL | Status: DC | PRN

## 2014-01-31 MED ORDER — TERBUTALINE SULFATE 1 MG/ML IJ SOLN
0.2500 mg | Freq: Once | INTRAMUSCULAR | Status: AC | PRN
Start: 2014-01-31 — End: 2014-01-31

## 2014-01-31 MED ORDER — OXYTOCIN 40 UNITS IN LACTATED RINGERS INFUSION - SIMPLE MED
1.0000 m[IU]/min | INTRAVENOUS | Status: DC
  Administered 2014-01-31: 2 m[IU]/min via INTRAVENOUS
  Filled 2014-01-31: qty 1000

## 2014-01-31 MED ORDER — TERBUTALINE SULFATE 1 MG/ML IJ SOLN
INTRAMUSCULAR | Status: AC
  Filled 2014-01-31: qty 1

## 2014-01-31 MED ORDER — LIDOCAINE HCL (PF) 1 % IJ SOLN
30.0000 mL | INTRAMUSCULAR | Status: DC | PRN
  Filled 2014-01-31: qty 30

## 2014-01-31 MED ORDER — FENTANYL 2.5 MCG/ML BUPIVACAINE 1/10 % EPIDURAL INFUSION (WH - ANES)
INTRAMUSCULAR | Status: DC | PRN
  Administered 2014-01-31: 14 mL/h via EPIDURAL

## 2014-01-31 MED ORDER — FENTANYL CITRATE 0.05 MG/ML IJ SOLN
100.0000 ug | Freq: Once | INTRAMUSCULAR | Status: AC
  Administered 2014-01-31: 100 ug via INTRAVENOUS
  Filled 2014-01-31: qty 2

## 2014-01-31 MED ORDER — FENTANYL CITRATE 0.05 MG/ML IJ SOLN
100.0000 ug | INTRAMUSCULAR | Status: DC | PRN
  Administered 2014-01-31: 100 ug via INTRAVENOUS
  Filled 2014-01-31: qty 2

## 2014-01-31 NOTE — Anesthesia Preprocedure Evaluation (Signed)
Anesthesia Evaluation  Patient identified by MRN, date of birth, ID band Patient awake    Reviewed: Allergy & Precautions, H&P , NPO status , Patient's Chart, lab work & pertinent test results  Airway Mallampati: I TM Distance: >3 FB Neck ROM: full    Dental no notable dental hx.    Pulmonary former smoker,    Pulmonary exam normal       Cardiovascular negative cardio ROS      Neuro/Psych negative neurological ROS  negative psych ROS   GI/Hepatic negative GI ROS, Neg liver ROS,   Endo/Other  negative endocrine ROS  Renal/GU negative Renal ROS     Musculoskeletal   Abdominal Normal abdominal exam  (+)   Peds  Hematology negative hematology ROS (+)   Anesthesia Other Findings   Reproductive/Obstetrics (+) Pregnancy                           Anesthesia Physical Anesthesia Plan  ASA: II  Anesthesia Plan: Epidural   Post-op Pain Management:    Induction:   Airway Management Planned:   Additional Equipment:   Intra-op Plan:   Post-operative Plan:   Informed Consent: I have reviewed the patients History and Physical, chart, labs and discussed the procedure including the risks, benefits and alternatives for the proposed anesthesia with the patient or authorized representative who has indicated his/her understanding and acceptance.     Plan Discussed with:   Anesthesia Plan Comments:         Anesthesia Quick Evaluation  

## 2014-01-31 NOTE — Anesthesia Procedure Notes (Signed)
Epidural Patient location during procedure: OB Start time: 01/31/2014 7:49 PM End time: 01/31/2014 7:53 PM  Staffing Anesthesiologist: Leilani AbleHATCHETT, Shanard Treto Performed by: anesthesiologist   Preanesthetic Checklist Completed: patient identified, surgical consent, pre-op evaluation, timeout performed, IV checked, risks and benefits discussed and monitors and equipment checked  Epidural Patient position: sitting Prep: site prepped and draped and DuraPrep Patient monitoring: continuous pulse ox and blood pressure Approach: midline Location: L3-L4 Injection technique: LOR air  Needle:  Needle type: Tuohy  Needle gauge: 17 G Needle length: 9 cm and 9 Needle insertion depth: 6 cm Catheter type: closed end flexible Catheter size: 19 Gauge Catheter at skin depth: 11 cm Test dose: negative and Other  Assessment Sensory level: T9 Events: blood not aspirated, injection not painful, no injection resistance, negative IV test and no paresthesia  Additional Notes Reason for block:procedure for pain

## 2014-01-31 NOTE — Progress Notes (Signed)
Jamie Hendricks is a 23 y.o. G3P2002 at 6351w1d admitted for induction of labor due to IUGR.  Subjective: Smiling, talking with her family at bedside.  Reports that contractions hurt, just not as much as they should.  Objective: BP 124/65 mmHg  Pulse 66  Temp(Src) 98.6 F (37 C) (Oral)  Resp 18  Ht 5' (1.524 m)  Wt 143 lb (64.864 kg)  BMI 27.93 kg/m2  LMP 05/09/2013    FHT:  FHR: 130 bpm, variability: moderate,  accelerations:  Present,  decelerations:  Absent UC:   regular, every 2-3 minutes  SVE:   Dilation: 4 Effacement (%): 60 Station: -2 Exam by:: Adams, SNM/Cres-Dishmon, CNM  Pitocin @ 22 mu/min  Labs: Lab Results  Component Value Date   WBC 9.6 01/31/2014   HGB 10.7* 01/31/2014   HCT 31.6* 01/31/2014   MCV 85.6 01/31/2014   PLT 311 01/31/2014    Assessment / Plan: Induction of labor due to IUGR,  progressing well on pitocin  Labor: Progressing slowly, AROM with clear fluids noted Fetal Wellbeing:  Category I Pain Control:  Labor support without medications Pre-eclampsia: N/A I/D:  N/A Anticipated MOD:  NSVD  ADAMS,SHNIQUAL SHWON Student NM 01/31/2014, 7:28 PM

## 2014-01-31 NOTE — H&P (Signed)
Jamie Hendricks is a 23 y.o. G3P2002 at 5963w1d by 7 week US presenting for IOL for IUGR.  Pt has IUGR <3%, pt has a two vessel cord and ultrasound also revealed LV EICF.  Pt is not currently feeling any contractions, denies any bleeding or fluid leak. 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    [facility]  FOB Earl LitesGregory simmons 23 yo bm 2nd  Dating By 7 week US  Pap negative  GC/CT Initial: -/- 36+wks:neg/neg  Genetic Screen NT/IT: declined  CF screen declined  Anatomic US Female, Lt EICF, 2VC  Flu vaccine   Tdap Recommended ~ 28wks  Glucose Screen  2 hr  GBS positive  Feed Preference bottle  Contraception Wants btl,, discussed larc's  Circumcision yes  Childbirth Classes declined  Pediatrician undecided  HSV2 positive    Maternal Medical History:  Reason for admission: Nausea.  Fetal activity: Perceived fetal activity is normal.   Last perceived fetal movement was within the past hour.    Prenatal complications: IUGR and placental abnormality.   Prenatal Complications - Diabetes: none.    OB History    Gravida Para Term Preterm AB TAB SAB Ectopic Multiple Living   3 2 2       2      Past Medical History  Diagnosis Date  . Medical history non-contributory   . Supervision of other normal pregnancy 07/30/2013   Past Surgical History  Procedure Laterality Date  . No past surgeries     Family History: family history includes Diabetes in her father, other, and paternal grandfather; Hypertension in her maternal grandmother and other; Seizures in her other. Social History:  reports that she has quit smoking. Her smoking use included Cigarettes. She has a 2 pack-year smoking history. She has never used smokeless tobacco. She reports that she does not drink alcohol or use illicit drugs.   Prenatal Transfer Tool  Maternal Diabetes: No Genetic Screening: Declined Maternal Ultrasounds/Referrals: Abnormal:  Findings:   IUGR, Isolated EIF (echogenic  intracardiac focus) Fetal Ultrasounds or other Referrals:  Other: F/U for IUGR and EIF Maternal Substance Abuse:  Yes:  Type: Smoker, Marijuana Significant Maternal Medications:  None Significant Maternal Lab Results:  Lab values include: Group B Strep positive Other Comments:  None  Review of Systems  Constitutional: Negative for fever and chills.  Eyes: Negative for blurred vision.  Gastrointestinal: Negative for nausea, vomiting and abdominal pain.  Neurological: Negative for headaches.      Blood pressure 123/76, pulse 78, temperature 97.8 F (36.6 C), temperature source Oral, resp. rate 18, height 5' (1.524 m), weight 143 lb (64.864 kg), last menstrual period 05/09/2013. Exam Physical Exam  Prenatal labs: ABO, Rh: O/POS/-- (06/15 1255) Antibody: NEG (10/13 0952) Rubella: 2.05 (06/15 1255) RPR: NON REAC (10/13 0952)  HBsAg: NEGATIVE (06/15 1255)  HIV: NONREACTIVE (10/13 0952)  GBS: Detected (12/14 1142)   Assessment/Plan: Admit pt for IOL for IUGR.  Pt was 3 cm with favorable cervix, starting induction with Pitocin.   Pt to have routine L&D care.      Benjamin Stainhompson, McKenzie L, MD 01/31/2014, 8:27 AM  I was present for the exam and agree with above. EFW 01/29/14 4-1 RU(0454lb(1833 gm)   Dorathy KinsmanVirginia Luwana Butrick, CNM 01/31/2014 12:38 PM

## 2014-02-01 MED ORDER — ZOLPIDEM TARTRATE 5 MG PO TABS
5.0000 mg | ORAL_TABLET | Freq: Every evening | ORAL | Status: DC | PRN
Start: 2014-02-01 — End: 2014-02-02

## 2014-02-01 MED ORDER — DIBUCAINE 1 % RE OINT: 1.0000 "application " | TOPICAL_OINTMENT | RECTAL | Status: DC | PRN

## 2014-02-01 MED ORDER — ONDANSETRON HCL 4 MG PO TABS: 4.0000 mg | ORAL_TABLET | ORAL | Status: DC | PRN

## 2014-02-01 MED ORDER — WITCH HAZEL-GLYCERIN EX PADS: 1.0000 "application " | MEDICATED_PAD | CUTANEOUS | Status: DC | PRN

## 2014-02-01 MED ORDER — OXYTOCIN 40 UNITS IN LACTATED RINGERS INFUSION - SIMPLE MED: 62.5000 mL/h | INTRAVENOUS | Status: DC | PRN

## 2014-02-01 MED ORDER — METHYLERGONOVINE MALEATE 0.2 MG/ML IJ SOLN: 0.2000 mg | INTRAMUSCULAR | Status: DC | PRN

## 2014-02-01 MED ORDER — ONDANSETRON HCL 4 MG/2ML IJ SOLN: 4.0000 mg | INTRAMUSCULAR | Status: DC | PRN

## 2014-02-01 MED ORDER — METHYLERGONOVINE MALEATE 0.2 MG PO TABS: 0.2000 mg | ORAL_TABLET | ORAL | Status: DC | PRN

## 2014-02-01 MED ORDER — DIPHENHYDRAMINE HCL 25 MG PO CAPS: 25.0000 mg | ORAL_CAPSULE | Freq: Four times a day (QID) | ORAL | Status: DC | PRN

## 2014-02-01 MED ORDER — TETANUS-DIPHTH-ACELL PERTUSSIS 5-2.5-18.5 LF-MCG/0.5 IM SUSP
0.5000 mL | Freq: Once | INTRAMUSCULAR | Status: AC
  Administered 2014-02-02: 0.5 mL via INTRAMUSCULAR
  Filled 2014-02-01: qty 0.5

## 2014-02-01 MED ORDER — FERROUS SULFATE 325 (65 FE) MG PO TABS
325.0000 mg | ORAL_TABLET | Freq: Two times a day (BID) | ORAL | Status: DC
  Administered 2014-02-01 – 2014-02-02 (×3): 325 mg via ORAL
  Filled 2014-02-01 (×3): qty 1

## 2014-02-01 MED ORDER — SENNOSIDES-DOCUSATE SODIUM 8.6-50 MG PO TABS
2.0000 | ORAL_TABLET | ORAL | Status: DC
  Administered 2014-02-01 – 2014-02-02 (×2): 2 via ORAL
  Filled 2014-02-01 (×2): qty 2

## 2014-02-01 MED ORDER — SIMETHICONE 80 MG PO CHEW: 80.0000 mg | CHEWABLE_TABLET | ORAL | Status: DC | PRN

## 2014-02-01 MED ORDER — BENZOCAINE-MENTHOL 20-0.5 % EX AERO: 1.0000 "application " | INHALATION_SPRAY | CUTANEOUS | Status: DC | PRN

## 2014-02-01 MED ORDER — MEASLES, MUMPS & RUBELLA VAC ~~LOC~~ INJ
0.5000 mL | INJECTION | Freq: Once | SUBCUTANEOUS | Status: DC
Start: 2014-02-01 — End: 2014-02-02
  Filled 2014-02-01: qty 0.5

## 2014-02-01 MED ORDER — OXYCODONE-ACETAMINOPHEN 5-325 MG PO TABS
2.0000 | ORAL_TABLET | ORAL | Status: DC | PRN
  Administered 2014-02-01 – 2014-02-02 (×7): 2 via ORAL
  Filled 2014-02-01 (×7): qty 2

## 2014-02-01 MED ORDER — FLEET ENEMA 7-19 GM/118ML RE ENEM: 1.0000 | ENEMA | Freq: Every day | RECTAL | Status: DC | PRN

## 2014-02-01 MED ORDER — PRENATAL MULTIVITAMIN CH
1.0000 | ORAL_TABLET | Freq: Every day | ORAL | Status: DC
  Administered 2014-02-01 – 2014-02-02 (×2): 1 via ORAL
  Filled 2014-02-01 (×2): qty 1

## 2014-02-01 MED ORDER — OXYCODONE-ACETAMINOPHEN 5-325 MG PO TABS: 1.0000 | ORAL_TABLET | ORAL | Status: DC | PRN

## 2014-02-01 MED ORDER — INFLUENZA VAC SPLIT QUAD 0.5 ML IM SUSY
0.5000 mL | PREFILLED_SYRINGE | Freq: Once | INTRAMUSCULAR | Status: AC
  Administered 2014-02-01: 0.5 mL via INTRAMUSCULAR

## 2014-02-01 MED ORDER — PNEUMOCOCCAL VAC POLYVALENT 25 MCG/0.5ML IJ INJ
0.5000 mL | INJECTION | Freq: Once | INTRAMUSCULAR | Status: AC
  Administered 2014-02-01: 0.5 mL via INTRAMUSCULAR
  Filled 2014-02-01: qty 0.5

## 2014-02-01 MED ORDER — BISACODYL 10 MG RE SUPP: 10.0000 mg | Freq: Every day | RECTAL | Status: DC | PRN

## 2014-02-01 MED ORDER — LANOLIN HYDROUS EX OINT: TOPICAL_OINTMENT | CUTANEOUS | Status: DC | PRN

## 2014-02-01 NOTE — Progress Notes (Signed)
Post Partum Day 1 Subjective: no complaints, up ad lib, voiding and tolerating PO, small lochia, plans to bottle feed, bilateral tubal ligation in 4 weeks  Objective: Blood pressure 91/59, pulse 63, temperature 97.5 F (36.4 C), temperature source Oral, resp. rate 18, height 5' (1.524 m), weight 143 lb (64.864 kg), last menstrual period 05/09/2013, SpO2 100 %, unknown if currently breastfeeding.  Physical Exam:  General: alert, cooperative and no distress Lochia:normal flow Chest: CTAB Heart: RRR no m/r/g Abdomen: +BS, soft, nontender,  Uterine Fundus: firm DVT Evaluation: No evidence of DVT seen on physical exam. Extremities: no edema   Recent Labs  01/31/14 0953  HGB 10.7*  HCT 31.6*    Assessment/Plan: Plan for discharge tomorrow   LOS: 1 day   CRESENZO-DISHMAN,Aquarius Tremper 02/01/2014, 7:09 AM

## 2014-02-01 NOTE — Progress Notes (Signed)
UR chart review completed.  

## 2014-02-01 NOTE — Progress Notes (Signed)
Clinical Social Work Department PSYCHOSOCIAL ASSESSMENT - MATERNAL/CHILD 02/01/2014  Patient:  Jamie Hendricks, Jamie Hendricks  Account Number:  [id number]  Admit Date:  01/31/2014  Ardine Eng Name:   Karalee Height   Clinical Social Worker:  Lucita Ferrara, CLINICAL SOCIAL WORKER   Date/Time:  02/01/2014 10:15 AM  Date Referred:  01/31/2014   Referral source  Central Nursery     Referred reason  Substance Abuse   Other referral source:    I:  FAMILY / [place] legal guardian:  PARENT  Guardian - Name Guardian - Age Guardian - Address  Gracey Tolle [address] [place], [place] [address]  Donne Anon  same as above   Other household support members/support persons Name Relationship DOB  [place] 2007  Gr[place] 2012   Other support:   MOB reported that her parents and the FOB's parents are actively involved in their lives and are supportive.    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  MOB had UDS positive for THC in June and September.  Baby's UDS is negative and MDS is pending.    V  SOCIAL WORK ASSESSMENT CSW met with the MOB due to maternal use of THC during pregnancy.  MOB presented as  easily engaged and receptive to the visit.  She displayed a bright and cheerful affect and presented in a pleasant mood.  MOB provided consent for the FOB to be in the room, but he presented as closed, guarded, and suspicious of the CSW.  MOB openly discussed history of THC use and CPS involvement.  At the end of the visit, MOB expressed appreciation for the visit, denied additional questions or concerns, and acknowledged ongoing CSW availability.   MOB expressed excitement upon the birth of her son since she has two daughters.  She shared that she is looking forward to the differences that accompany raising a boy versus girls.  MOB smiled as she reflected upon how her daughters interacted with the baby after the baby was born.  MOB reported that the home is well prepared for the baby and that she is feeling well supported by all of her family members. 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  MOB denied presence of any acute stressors that may negatively impact her transition to the postpartum period, and denied mental health history/history of postpartum depression. Despite low risk of postpartum depression, CSW reviewed signs and symptoms of the baby blues and postpartum depression with the MOB.     MOB stated that she had been anticipating a visit from the [place] since she has a  history of THC use during pregnancy.  She shared that it was due to nausea, and discussed that she felt need to use it in order to help her eat.  MOB was unable to clarify exact use during pregnancy, but stated that her last use was in October.  MOB denied questions or concerns about the hospital drug screen policy, and she was informed that the baby's UDS is negative.  MOB denied concerns about CPS involvement if the MDS is positive and stated that CPS was involved when her second child was born since her UDS was positive for THC.   MOB confirmed  history of CPS involvement on/off with both of her children.  She stated that there was a period in time when she lost custody of her children.  She shared that they were placed with her father since "we had things to do/take care of".  She stated that they were living in a place "where other people were doing things that they didn't think children should be exposed to".  MOB stated that she received custody of her children back one year ago after she completed parenting classes, drug classes, and secured her own housing.  MOB denied any current CPS involvement.  Given CPS involvement, CSW acknowledges how FOB may be concerned/nervous around CSW.  CSW inquired about FOB's presentation of being closed and guarded. FOB shared that he is a "quiet person".  CSW reviewed CSW role in the hospital, and FOB began to present as more relaxed.    CSW contacted [facility] CPS, and they confirmed that there is not an open CPS case and that her daughters have been returned to her care.    No barriers to discharge.    VI SOCIAL WORK PLAN Social Work Secretary/administrator Education   Type of pt/family education:   Postpartum depression  Hospital drug screen policy   If child protective services report - county:   If child protective services report - date:   Information/referral to community resources comment:   No referrals needed at this time. MOB voiced intention to contact her MD if she notes symptoms of postpartum depression.    Other social work plan:   CSW to follow-up PRN. CSW to monitor MDS and will make CPS report if MDS is positive.

## 2014-02-01 NOTE — Anesthesia Postprocedure Evaluation (Signed)
Anesthesia Post Note  Patient: Jamie Hendricks  Procedure(s) Performed: * No procedures listed *  Anesthesia type: Epidural  Patient location: Mother/Baby  Post pain: Pain level controlled  Post assessment: Post-op Vital signs reviewed  Last Vitals:  Filed Vitals:   02/01/14 0550  BP: 91/59  Pulse: 63  Temp: 36.4 C  Resp: 18    Post vital signs: Reviewed  Level of consciousness: awake  Complications: No apparent anesthesia complications

## 2014-02-02 MED ORDER — OXYCODONE-ACETAMINOPHEN 5-325 MG PO TABS: 1.0000 | ORAL_TABLET | ORAL | Status: DC | PRN

## 2014-02-02 MED ORDER — FERROUS SULFATE 325 (65 FE) MG PO TABS: 325.0000 mg | ORAL_TABLET | Freq: Two times a day (BID) | ORAL | Status: DC

## 2014-02-02 MED ORDER — IBUPROFEN 600 MG PO TABS: 600.0000 mg | ORAL_TABLET | Freq: Four times a day (QID) | ORAL | Status: DC | PRN

## 2014-02-02 NOTE — Discharge Summary (Signed)
Obstetric Discharge Summary Reason for Admission: induction of labor for IUGR Prenatal Procedures: NST and ultrasound Intrapartum Procedures: spontaneous vaginal delivery Postpartum Procedures: none Complications-Operative and Postpartum: none  Eating, drinking, voiding, ambulating well.  +flatus.  Lochia wnl. Requiring percocet for pain- requests rx. Denies dizziness, lightheadedness, or sob. No complaints.  Bottlefeeding. Plans on interval PP BTL in 4 weeks.  HEMOGLOBIN  Date Value Ref Range Status  01/31/2014 10.7* 12.0 - 15.0 g/dL Final   HCT  Date Value Ref Range Status  01/31/2014 31.6* 36.0 - 46.0 % Final    Physical Exam:  General: alert, cooperative and no distress Lochia: appropriate Uterine Fundus: firm Incision: n/a DVT Evaluation: No evidence of DVT seen on physical exam. Negative Homan's sign. No cords or calf tenderness. No significant calf/ankle edema.  Discharge Diagnoses: Term Pregnancy-delivered  Discharge Information: Date: 02/02/2014 Activity: pelvic rest Diet: routine Medications: PNV, Ibuprofen, Iron and 10 percocet Condition: stable Instructions: refer to practice specific booklet Discharge to: home Follow-up Information    [facility].   Specialty:  Obstetrics and Gynecology   Why:  as scheduled for your pre-op appointment   Contact information:   [address] [place] [id number] [phone]      [facility].   Specialty:  Obstetrics and Gynecology   Why:  for your son's circumcision, you have to pay $244 at the time it is done   Contact information:   [address] [place] [id number] [phone]      Newborn Data: Live born female  Birth Weight: 4 lb 15.2 oz (2245 g) APGAR: 9, 9  Home with mother tomorrow per peds. Pt will room-in as long as OK w/ nursing   Jamie DuncansBooker, Jamie Hendricks 02/02/2014, 10:22 AM

## 2014-02-02 NOTE — Discharge Instructions (Signed)
NO SEX UNTIL AFTER YOUR TUBES ARE TIED Postpartum Care After Vaginal Delivery After you deliver your newborn (postpartum period), the usual stay in the hospital is 24-72 hours. If there were problems with your labor or delivery, or if you have other medical problems, you might be in the hospital longer.  While you are in the hospital, you will receive help and instructions on how to care for yourself and your newborn during the postpartum period.  While you are in the hospital:  Be sure to tell your nurses if you have pain or discomfort, as well as where you feel the pain and what makes the pain worse.  If you had an incision made near your vagina (episiotomy) or if you had some tearing during delivery, the nurses may put ice packs on your episiotomy or tear. The ice packs may help to reduce the pain and swelling.  If you are breastfeeding, you may feel uncomfortable contractions of your uterus for a couple of weeks. This is normal. The contractions help your uterus get back to normal size.  It is normal to have some bleeding after delivery.  For the first 1-3 days after delivery, the flow is red and the amount may be similar to a period.  It is common for the flow to start and stop.  In the first few days, you may pass some small clots. Let your nurses know if you begin to pass large clots or your flow increases.  Do not  flush blood clots down the toilet before having the nurse look at them.  During the next 3-10 days after delivery, your flow should become more watery and pink or brown-tinged in color.  Ten to fourteen days after delivery, your flow should be a small amount of yellowish-white discharge.  The amount of your flow will decrease over the first few weeks after delivery. Your flow may stop in 6-8 weeks. Most women have had their flow stop by 12 weeks after delivery.  You should change your sanitary pads frequently.  Wash your hands thoroughly with soap and water for at  least 20 seconds after changing pads, using the toilet, or before holding or feeding your newborn.  You should feel like you need to empty your bladder within the first 6-8 hours after delivery.  In case you become weak, lightheaded, or faint, call your nurse before you get out of bed for the first time and before you take a shower for the first time.  Within the first few days after delivery, your breasts may begin to feel tender and full. This is called engorgement. Breast tenderness usually goes away within 48-72 hours after engorgement occurs. You may also notice milk leaking from your breasts. If you are not breastfeeding, do not stimulate your breasts. Breast stimulation can make your breasts produce more milk.  Spending as much time as possible with your newborn is very important. During this time, you and your newborn can feel close and get to know each other. Having your newborn stay in your room (rooming in) will help to strengthen the bond with your newborn. It will give you time to get to know your newborn and become comfortable caring for your newborn.  Your hormones change after delivery. Sometimes the hormone changes can temporarily cause you to feel sad or tearful. These feelings should not last more than a few days. If these feelings last longer than that, you should talk to your caregiver.  If desired, talk to your  caregiver about methods of family planning or contraception.  Talk to your caregiver about immunizations. Your caregiver may want you to have the following immunizations before leaving the hospital:  Tetanus, diphtheria, and pertussis (Tdap) or tetanus and diphtheria (Td) immunization. It is very important that you and your family (including grandparents) or others caring for your newborn are up-to-date with the Tdap or Td immunizations. The Tdap or Td immunization can help protect your newborn from getting ill.  Rubella immunization.  Varicella (chickenpox)  immunization.  Influenza immunization. You should receive this annual immunization if you did not receive the immunization during your pregnancy. Document Released: 11/29/2006 Document Revised: 10/27/2011 Document Reviewed: 09/29/2011 [facility] Patient Information 2015 [employer] [place]. This information is not intended to replace advice given to you by your health care provider. Make sure you discuss any questions you have with your health care provider.

## 2014-02-26 ENCOUNTER — Encounter: Payer: Self-pay | Admitting: *Deleted

## 2014-02-26 ENCOUNTER — Ambulatory Visit: Payer: Medicaid Other | Admitting: Obstetrics & Gynecology

## 2014-03-07 ENCOUNTER — Ambulatory Visit (INDEPENDENT_AMBULATORY_CARE_PROVIDER_SITE_OTHER): Payer: Medicaid Other | Admitting: Obstetrics & Gynecology

## 2014-03-07 ENCOUNTER — Encounter: Payer: Self-pay | Admitting: Obstetrics & Gynecology

## 2014-03-07 NOTE — Progress Notes (Signed)
Patient ID: Jamie Hendricks, female   DOB: 11-18-1990, 24 y.o.   MRN: [id number] Subjective:     Jamie Hendricks is a 24 y.o. female who presents for a postpartum visit. She is 5 weeks postpartum following a spontaneous vaginal delivery. I have fully reviewed the prenatal and intrapartum course. The delivery was at 37 gestational weeks. Outcome: spontaneous vaginal delivery. Anesthesia: epidural. Postpartum course has been unremarkale. Baby's course has been uneventful. Baby is feeding by . Bleeding no bleeding. Bowel function is normal. Bladder function is normal. Patient is sexually active. Contraception method is condoms. Postpartum depression screening: negative.  The following portions of the patient's history were reviewed and updated as appropriate: allergies, current medications, past family history, past medical history, past social history, past surgical history and problem list.  Review of Systems Pertinent items are noted in HPI.   Objective:    BP 110/80 mmHg  Ht 5' (1.524 m)  Wt 126 lb (57.153 kg)  BMI 24.61 kg/m2  LMP 03/03/2014  Breastfeeding? No  General:  alert, cooperative and no distress   Breasts:    Lungs:   Heart:    Abdomen: soft, non-tender; bowel sounds normal; no masses,  no organomegaly   Vulva:  normal  Vagina: normal  Cervix:  no cervical motion tenderness and normal  Corpus: normal size, contour, position, consistency, mobility, non-tender  Adnexa:  normal adnexa  Rectal Exam:         Assessment:     normal postpartum exam. Pap smear not done at today's visit.   Plan:    1. Contraception: tubal ligation 2. Schedule laparoscopic salpingectomy for 03/20/2014 3. Follow up in: 1 week or as needed. pre op

## 2014-03-14 ENCOUNTER — Ambulatory Visit (INDEPENDENT_AMBULATORY_CARE_PROVIDER_SITE_OTHER): Payer: Medicaid Other | Admitting: Obstetrics & Gynecology

## 2014-03-14 ENCOUNTER — Encounter: Payer: Self-pay | Admitting: Obstetrics & Gynecology

## 2014-03-14 VITALS — BP 110/80 | Wt 126.0 lb

## 2014-03-14 DIAGNOSIS — Z01818 Encounter for other preprocedural examination: Secondary | ICD-10-CM

## 2014-03-14 DIAGNOSIS — Z3009 Encounter for other general counseling and advice on contraception: Secondary | ICD-10-CM

## 2014-03-14 NOTE — Progress Notes (Signed)
Patient ID: Jamie Hendricks, female   DOB: 1990-02-26, 24 y.o.   MRN: [id number] Preoperative History and Physical  Jamie Hendricks is a 24 y.o. [phone] with Patient's last menstrual period was 03/03/2014. admitted for a laparoscopic bilateral salpingectomy for sterilization.    PMH:    Past Medical History  Diagnosis Date  . Medical history non-contributory   . Supervision of other normal pregnancy 07/30/2013    PSH:     Past Surgical History  Procedure Laterality Date  . No past surgeries      POb/GynH:      OB History    Gravida Para Term Preterm AB TAB SAB Ectopic Multiple Living   3 3 3       0 3      SH:   History  Substance Use Topics  . Smoking status: Former Smoker -- 0.25 packs/day for 8 years    Types: Cigarettes  . Smokeless tobacco: Never Used  . Alcohol Use: No     Comment: social; not now    FH:    Family History  Problem Relation Age of Onset  . Seizures Other   . Hypertension Other   . Diabetes Other   . Diabetes Father   . Hypertension Maternal Grandmother   . Diabetes Paternal Grandfather      Allergies: No Known Allergies  Medications:       Current outpatient prescriptions:  .  ferrous sulfate 325 (65 FE) MG tablet, Take 1 tablet (325 mg total) by mouth 2 (two) times daily with a meal. (Patient not taking: Reported on 03/14/2014), Disp: 60 tablet, Rfl: 3 .  ibuprofen (ADVIL,MOTRIN) 600 MG tablet, Take 1 tablet (600 mg total) by mouth every 6 (six) hours as needed for mild pain, moderate pain or cramping. (Patient not taking: Reported on 03/14/2014), Disp: 30 tablet, Rfl: 0 .  oxyCODONE-acetaminophen (PERCOCET/ROXICET) 5-325 MG per tablet, Take 1 tablet by mouth every 4 (four) hours as needed for severe pain. (Patient not taking: Reported on 03/07/2014), Disp: 10 tablet, Rfl: 0 .  Pediatric Multivit-Minerals-C (FLINTSTONES GUMMIES PO), Take 2 tablets by mouth daily. , Disp: , Rfl:   Review of Systems:   Review of Systems  Constitutional:  Negative for fever, chills, weight loss, malaise/fatigue and diaphoresis.  HENT: Negative for hearing loss, ear pain, nosebleeds, congestion, sore throat, neck pain, tinnitus and ear discharge.   Eyes: Negative for blurred vision, double vision, photophobia, pain, discharge and redness.  Respiratory: Negative for cough, hemoptysis, sputum production, shortness of breath, wheezing and stridor.   Cardiovascular: Negative for chest pain, palpitations, orthopnea, claudication, leg swelling and PND.  Gastrointestinal: Positive for abdominal pain. Negative for heartburn, nausea, vomiting, diarrhea, constipation, blood in stool and melena.  Genitourinary: Negative for dysuria, urgency, frequency, hematuria and flank pain.  Musculoskeletal: Negative for myalgias, back pain, joint pain and falls.  Skin: Negative for itching and rash.  Neurological: Negative for dizziness, tingling, tremors, sensory change, speech change, focal weakness, seizures, loss of consciousness, weakness and headaches.  Endo/Heme/Allergies: Negative for environmental allergies and polydipsia. Does not bruise/bleed easily.  Psychiatric/Behavioral: Negative for depression, suicidal ideas, hallucinations, memory loss and substance abuse. The patient is not nervous/anxious and does not have insomnia.      PHYSICAL EXAM:  Blood pressure 110/80, weight 126 lb (57.153 kg), last menstrual period 03/03/2014, not currently breastfeeding.    Vitals reviewed. Constitutional: She is oriented to person, place, and time. She appears well-developed and well-nourished.  HENT:  Head: Normocephalic  and atraumatic.  Right Ear: External ear normal.  Left Ear: External ear normal.  Nose: Nose normal.  Mouth/Throat: Oropharynx is clear and moist.  Eyes: Conjunctivae and EOM are normal. Pupils are equal, round, and reactive to light. Right eye exhibits no discharge. Left eye exhibits no discharge. No scleral icterus.  Neck: Normal range of  motion. Neck supple. No tracheal deviation present. No thyromegaly present.  Cardiovascular: Normal rate, regular rhythm, normal heart sounds and intact distal pulses.  Exam reveals no gallop and no friction rub.   No murmur heard. Respiratory: Effort normal and breath sounds normal. No respiratory distress. She has no wheezes. She has no rales. She exhibits no tenderness.  GI: Soft. Bowel sounds are normal. She exhibits no distension and no mass. There is tenderness. There is no rebound and no guarding.  Genitourinary:       Vulva is normal without lesions Vagina is pink moist without discharge Cervix normal in appearance and pap is normal Uterus is normal Adnexa is negative with normal sized ovaries by sonogram  Musculoskeletal: Normal range of motion. She exhibits no edema and no tenderness.  Neurological: She is alert and oriented to person, place, and time. She has normal reflexes. She displays normal reflexes. No cranial nerve deficit. She exhibits normal muscle tone. Coordination normal.  Skin: Skin is warm and dry. No rash noted. No erythema. No pallor.  Psychiatric: She has a normal mood and affect. Her behavior is normal. Judgment and thought content normal.    Labs: No results found for this or any previous visit (from the past 336 hour(s)).  EKG: Orders placed or performed during the hospital encounter of 06/12/11  . EKG 12-Lead  . EKG 12-Lead  . EKG    Imaging Studies: No results found.    Assessment: [name] desires permanent sterilization Patient Active Problem List   Diagnosis Date Noted  . Pregnancy 01/31/2014  . Contraception tubal ligation at 4-6 wk pp 01/28/2014  . IUGR (intrauterine growth restriction) 11/27/2013  . Echogenic intracardiac focus of fetus on prenatal ultrasound 10/30/2013  . Preterm contractions 10/30/2013  . BV (bacterial vaginosis) 10/26/2013  . Two vessel umbilical cord, antepartum 10/01/2013  . 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  H/O postpartum hemorrhage, currently pregnant 08/27/2013  . Smoker 08/27/2013  . Supervision of high risk pregnancy in third trimester 07/30/2013    Plan: Laparoscopic bilateral salpingectomy for sterilization  EURE,LUTHER H 03/14/2014 12:06 PM

## 2014-03-15 NOTE — Patient Instructions (Signed)
Jamie Hendricks  03/15/2014   Your procedure is scheduled on:  03/20/2014  Report to [facility] at 8:10 AM.  Call this number if you have problems the morning of surgery: [phone]   Remember:   Do not eat food or drink liquids after midnight.   Take these medicines the morning of surgery with A SIP OF WATER: Oxycodone if needed   Do not wear jewelry, make-up or nail polish.  Do not wear lotions, powders, or perfumes. You may wear deodorant.  Do not shave 48 hours prior to surgery. Men may shave face and neck.  Do not bring valuables to the hospital.  [facility] is not responsible for any belongings or valuables.               Contacts, dentures or bridgework may not be worn into surgery.  Leave suitcase in the car. After surgery it may be brought to your room.  For patients admitted to the hospital, discharge time is determined by your treatment team.               Patients discharged the day of surgery will not be allowed to drive home.  Name and phone number of your driver:   Special Instructions: Shower using CHG 2 nights before surgery and the night before surgery.  If you shower the day of surgery use CHG.  Use special wash - you have one bottle of CHG for all showers.  You should use approximately 1/3 of the bottle for each shower.   Please read over the following fact sheets that you were given: Surgical Site Infection Prevention and Anesthesia Post-op Instructions  PATIENT INSTRUCTIONS POST-ANESTHESIA  IMMEDIATELY FOLLOWING SURGERY:  Do not drive or operate machinery for the first twenty four hours after surgery.  Do not make any important decisions for twenty four hours after surgery or while taking narcotic pain medications or sedatives.  If you develop intractable nausea and vomiting or a severe headache please notify your doctor immediately.  FOLLOW-UP:  Please make an appointment with your surgeon as instructed. You do not need to follow up with anesthesia unless  specifically instructed to do so.  WOUND CARE INSTRUCTIONS (if applicable):  Keep a dry clean dressing on the anesthesia/puncture wound site if there is drainage.  Once the wound has quit draining you may leave it open to air.  Generally you should leave the bandage intact for twenty four hours unless there is drainage.  If the epidural site drains for more than 36-48 hours please call the anesthesia department.  QUESTIONS?:  Please feel free to call your physician or the hospital operator if you have any questions, and they will be happy to assist you.      Salpingectomy Salpingectomy, also called tubectomy, is the surgical removal of one of the fallopian tubes. The fallopian tubes are tubes that are connected to the uterus. These tubes transport the egg from the ovary to the uterus. A salpingectomy may be done for various reasons, including:   A tubal (ectopic) pregnancy. This is especially true if the tube ruptures.  An infected fallopian tube.  The need to remove the fallopian tube when removing an ovary with a cyst or tumor.  The need to remove the fallopian tube when removing the uterus.  Cancer of the fallopian tube or nearby organs. Removing one fallopian tube does not prevent you from becoming pregnant. It also does not cause problems with your menstrual periods.  LET [facility]  CARE PROVIDER KNOW ABOUT:  Any allergies you have.  All medicines you are taking, including vitamins, herbs, eye drops, creams, and over-the-counter medicines.  Previous problems you or members of your family have had with the use of anesthetics.  Any blood disorders you have.  Previous surgeries you have had.  Medical conditions you have. RISKS AND COMPLICATIONS  Generally, this is a safe procedure. However, as with any procedure, complications can occur. Possible complications include:  Injury to surrounding organs.  Bleeding.  Infection.  Problems related to anesthesia. BEFORE THE  PROCEDURE  Ask your health care provider about changing or stopping your regular medicines. You may need to stop taking certain medicines, such as aspirin or blood thinners, at least 1 week before the surgery.  Do not eat or drink anything for at least 8 hours before the surgery.  If you smoke, do not smoke for at least 2 weeks before the surgery.  Make plans to have someone drive you home after the procedure or after your hospital stay. Also arrange for someone to help you with activities during recovery. PROCEDURE   You will be given medicine to help you relax before the procedure (sedative). You will then be given medicine to make you sleep through the procedure (general anesthetic). These medicines will be given through an IV access tube that is put into one of your veins.  Once you are asleep, your lower abdomen will be shaved and cleaned. A thin, flexible tube (catheter) will be placed in your bladder.  The surgeon may use a laparoscopic, robotic, or open technique for this surgery:  In the laparoscopic technique, the surgery is done through two small cuts (incisions) in the abdomen. A thin, lighted tube with a tiny camera on the end (laparoscope) is inserted into one of the incisions. The tools needed for the procedure are put through the other incision.  A robotic technique may be chosen to perform complex surgery in a small space. In the robotic technique, small incisions will be made. A camera and surgical instruments are passed through the incisions. Surgical instruments will be controlled with the help of a robotic arm.  In the open technique, the surgery is done through one large incision in the abdomen.  Using any of these techniques, the surgeon removes the fallopian tube from where it attaches to the uterus. The blood vessels will be clamped and tied.  The surgeon then uses staples or stitches to close the incision or incisions. AFTER THE PROCEDURE   You will be taken to a  recovery area where your progress will be monitored for 1-3 hours.  If the laparoscopic technique was used, you may be allowed to go home after several hours. You may have some shoulder pain after the laparoscopic procedure. This is normal and usually goes away in a day or two.  If the open technique was used, you will be admitted to the hospital for a couple of days.  You will be given pain medicine if needed.  The IV access tube and catheter will be removed before you are discharged. Document Released: 06/20/2008 Document Revised: 11/22/2012 Document Reviewed: 07/26/2012 [facility] Patient Information 2015 [place], [place]. This information is not intended to replace advice given to you by your health care provider. Make sure you discuss any questions you have with your health care provider.

## 2014-03-18 ENCOUNTER — Encounter (HOSPITAL_COMMUNITY)
Admission: RE | Admit: 2014-03-18 | Discharge: 2014-03-18 | Disposition: A | Discharge status: Home / Self Care | Payer: Medicaid Other | Source: Ambulatory Visit | Attending: Obstetrics & Gynecology | Admitting: Obstetrics & Gynecology

## 2014-03-18 ENCOUNTER — Encounter (HOSPITAL_COMMUNITY): Payer: Self-pay

## 2014-03-18 DIAGNOSIS — Z302 Encounter for sterilization: Secondary | ICD-10-CM | POA: Diagnosis not present

## 2014-03-18 DIAGNOSIS — F17210 Nicotine dependence, cigarettes, uncomplicated: Secondary | ICD-10-CM | POA: Diagnosis not present

## 2014-03-18 DIAGNOSIS — F1290 Cannabis use, unspecified, uncomplicated: Secondary | ICD-10-CM | POA: Diagnosis not present

## 2014-03-18 LAB — CBC
HEMATOCRIT: 33.9 % — AB (ref 36.0–46.0)
Hemoglobin: 10.9 g/dL — ABNORMAL LOW (ref 12.0–15.0)
MCH: 28 pg (ref 26.0–34.0)
MCHC: 32.2 g/dL (ref 30.0–36.0)
MCV: 87.1 fL (ref 78.0–100.0)
Platelets: 348 10*3/uL (ref 150–400)
RBC: 3.89 MIL/uL (ref 3.87–5.11)
RDW: 14.1 % (ref 11.5–15.5)
WBC: 7.3 10*3/uL (ref 4.0–10.5)

## 2014-03-18 LAB — COMPREHENSIVE METABOLIC PANEL
ALK PHOS: 54 U/L (ref 39–117)
ALT: 13 U/L (ref 0–35)
ANION GAP: 7 (ref 5–15)
AST: 18 U/L (ref 0–37)
Albumin: 3.9 g/dL (ref 3.5–5.2)
BUN: 10 mg/dL (ref 6–23)
CALCIUM: 9.4 mg/dL (ref 8.4–10.5)
CHLORIDE: 107 mmol/L (ref 96–112)
CO2: 24 mmol/L (ref 19–32)
Creatinine, Ser: 0.75 mg/dL (ref 0.50–1.10)
GFR calc Af Amer: >90 mL/min (ref >90)
GFR calc non Af Amer: >90 mL/min (ref >90)
Glucose, Bld: 124 mg/dL — ABNORMAL HIGH (ref 70–99)
Potassium: 3.6 mmol/L (ref 3.5–5.1)
Sodium: 138 mmol/L (ref 135–145)
Total Bilirubin: 0.4 mg/dL (ref 0.3–1.2)
Total Protein: 6.7 g/dL (ref 6.0–8.3)

## 2014-03-18 LAB — URINALYSIS, ROUTINE W REFLEX MICROSCOPIC
BILIRUBIN URINE: NEGATIVE
Glucose, UA: NEGATIVE mg/dL
Ketones, ur: NEGATIVE mg/dL
LEUKOCYTES UA: NEGATIVE
NITRITE: NEGATIVE
PROTEIN: NEGATIVE mg/dL
UROBILINOGEN UA: 0.2 mg/dL (ref 0.0–1.0)
pH: 6 (ref 5.0–8.0)

## 2014-03-18 LAB — URINE MICROSCOPIC-ADD ON

## 2014-03-18 LAB — HCG, QUANTITATIVE, PREGNANCY: hCG, Beta Chain, Quant, S: 1 m[IU]/mL (ref <5)

## 2014-03-20 ENCOUNTER — Ambulatory Visit (HOSPITAL_COMMUNITY)
Admission: RE | Admit: 2014-03-20 | Discharge: 2014-03-20 | Disposition: A | Discharge status: Home / Self Care | Payer: Medicaid Other | Source: Ambulatory Visit | Attending: Obstetrics & Gynecology | Admitting: Obstetrics & Gynecology

## 2014-03-20 ENCOUNTER — Ambulatory Visit (HOSPITAL_COMMUNITY): Payer: Medicaid Other | Admitting: Anesthesiology

## 2014-03-20 ENCOUNTER — Encounter (HOSPITAL_COMMUNITY)
Admission: RE | Disposition: A | Discharge status: Home / Self Care | Payer: Self-pay | Source: Ambulatory Visit | Attending: Obstetrics & Gynecology

## 2014-03-20 DIAGNOSIS — F17210 Nicotine dependence, cigarettes, uncomplicated: Secondary | ICD-10-CM | POA: Diagnosis not present

## 2014-03-20 DIAGNOSIS — Z302 Encounter for sterilization: Secondary | ICD-10-CM | POA: Insufficient documentation

## 2014-03-20 DIAGNOSIS — F1290 Cannabis use, unspecified, uncomplicated: Secondary | ICD-10-CM | POA: Diagnosis not present

## 2014-03-20 HISTORY — PX: LAPAROSCOPIC BILATERAL SALPINGECTOMY: SHX5889

## 2014-03-20 SURGERY — SALPINGECTOMY, BILATERAL, LAPAROSCOPIC
Anesthesia: General | Site: Abdomen | Laterality: Bilateral

## 2014-03-20 MED ORDER — LIDOCAINE HCL (CARDIAC) 10 MG/ML IV SOLN
INTRAVENOUS | Status: DC | PRN
  Administered 2014-03-20: 50 mg via INTRAVENOUS

## 2014-03-20 MED ORDER — LACTATED RINGERS IV SOLN
INTRAVENOUS | Status: DC
  Administered 2014-03-20: 1000 mL via INTRAVENOUS
  Administered 2014-03-20 (×2): via INTRAVENOUS

## 2014-03-20 MED ORDER — GLYCOPYRROLATE 0.2 MG/ML IJ SOLN
INTRAMUSCULAR | Status: AC
Start: 2014-03-20 — End: 2014-03-20
  Filled 2014-03-20: qty 4

## 2014-03-20 MED ORDER — BUPIVACAINE LIPOSOME 1.3 % IJ SUSP
INTRAMUSCULAR | Status: DC | PRN
  Administered 2014-03-20: 20 mL

## 2014-03-20 MED ORDER — ONDANSETRON HCL 4 MG/2ML IJ SOLN
4.0000 mg | Freq: Once | INTRAMUSCULAR | Status: AC
  Administered 2014-03-20: 4 mg via INTRAVENOUS

## 2014-03-20 MED ORDER — PROPOFOL 10 MG/ML IV BOLUS
INTRAVENOUS | Status: AC
  Filled 2014-03-20: qty 20

## 2014-03-20 MED ORDER — BUPIVACAINE LIPOSOME 1.3 % IJ SUSP
INTRAMUSCULAR | Status: AC
  Filled 2014-03-20: qty 20

## 2014-03-20 MED ORDER — FENTANYL CITRATE 0.05 MG/ML IJ SOLN
INTRAMUSCULAR | Status: AC
  Filled 2014-03-20: qty 5

## 2014-03-20 MED ORDER — FENTANYL CITRATE 0.05 MG/ML IJ SOLN
INTRAMUSCULAR | Status: DC | PRN
  Administered 2014-03-20 (×5): 50 ug via INTRAVENOUS

## 2014-03-20 MED ORDER — NEOSTIGMINE METHYLSULFATE 10 MG/10ML IV SOLN
INTRAVENOUS | Status: AC
  Filled 2014-03-20: qty 1

## 2014-03-20 MED ORDER — CEFAZOLIN SODIUM-DEXTROSE 2-3 GM-% IV SOLR
INTRAVENOUS | Status: DC | PRN
  Administered 2014-03-20: 2 g via INTRAVENOUS

## 2014-03-20 MED ORDER — KETOROLAC TROMETHAMINE 30 MG/ML IJ SOLN
30.0000 mg | Freq: Once | INTRAMUSCULAR | Status: AC
  Administered 2014-03-20: 30 mg via INTRAVENOUS
  Filled 2014-03-20: qty 1

## 2014-03-20 MED ORDER — BUPIVACAINE LIPOSOME 1.3 % IJ SUSP: 20.0000 mL | Freq: Once | INTRAMUSCULAR | Status: DC

## 2014-03-20 MED ORDER — ONDANSETRON HCL 8 MG PO TABS: 8.0000 mg | ORAL_TABLET | Freq: Three times a day (TID) | ORAL | Status: DC | PRN

## 2014-03-20 MED ORDER — GLYCOPYRROLATE 0.2 MG/ML IJ SOLN
INTRAMUSCULAR | Status: AC
  Filled 2014-03-20: qty 3

## 2014-03-20 MED ORDER — MIDAZOLAM HCL 2 MG/2ML IJ SOLN
1.0000 mg | INTRAMUSCULAR | Status: DC | PRN
Start: 2014-03-20 — End: 2014-03-20
  Administered 2014-03-20: 2 mg via INTRAVENOUS

## 2014-03-20 MED ORDER — MIDAZOLAM HCL 2 MG/2ML IJ SOLN
INTRAMUSCULAR | Status: AC
  Filled 2014-03-20: qty 2

## 2014-03-20 MED ORDER — ONDANSETRON HCL 4 MG/2ML IJ SOLN: 4.0000 mg | Freq: Once | INTRAMUSCULAR | Status: DC | PRN

## 2014-03-20 MED ORDER — CEFAZOLIN SODIUM-DEXTROSE 2-3 GM-% IV SOLR
2.0000 g | INTRAVENOUS | Status: AC
  Administered 2014-03-20: 2 g via INTRAVENOUS
  Filled 2014-03-20: qty 50

## 2014-03-20 MED ORDER — SODIUM CHLORIDE 0.9 % IR SOLN
Status: DC | PRN
  Administered 2014-03-20: 1000 mL

## 2014-03-20 MED ORDER — PROPOFOL 10 MG/ML IV BOLUS
INTRAVENOUS | Status: DC | PRN
  Administered 2014-03-20: 120 mg via INTRAVENOUS

## 2014-03-20 MED ORDER — ARTIFICIAL TEARS OP OINT
TOPICAL_OINTMENT | OPHTHALMIC | Status: AC
  Filled 2014-03-20: qty 3.5

## 2014-03-20 MED ORDER — NEOSTIGMINE METHYLSULFATE 10 MG/10ML IV SOLN
INTRAVENOUS | Status: DC | PRN
  Administered 2014-03-20: 1 mg via INTRAVENOUS
  Administered 2014-03-20: 4 mg via INTRAVENOUS
  Administered 2014-03-20: 1 mg via INTRAVENOUS

## 2014-03-20 MED ORDER — ONDANSETRON HCL 4 MG/2ML IJ SOLN
INTRAMUSCULAR | Status: AC
  Filled 2014-03-20: qty 2

## 2014-03-20 MED ORDER — ROCURONIUM BROMIDE 100 MG/10ML IV SOLN
INTRAVENOUS | Status: DC | PRN
  Administered 2014-03-20: 30 mg via INTRAVENOUS

## 2014-03-20 MED ORDER — KETOROLAC TROMETHAMINE 10 MG PO TABS: 10.0000 mg | ORAL_TABLET | Freq: Three times a day (TID) | ORAL | Status: DC | PRN

## 2014-03-20 MED ORDER — FENTANYL CITRATE 0.05 MG/ML IJ SOLN: 25.0000 ug | INTRAMUSCULAR | Status: DC | PRN

## 2014-03-20 MED ORDER — GLYCOPYRROLATE 0.2 MG/ML IJ SOLN
INTRAMUSCULAR | Status: DC | PRN
  Administered 2014-03-20: 8 mg via INTRAVENOUS

## 2014-03-20 MED ORDER — OXYCODONE HCL 5 MG PO TABS: 5.0000 mg | ORAL_TABLET | ORAL | Status: DC | PRN

## 2014-03-20 MED ORDER — ROCURONIUM BROMIDE 50 MG/5ML IV SOLN
INTRAVENOUS | Status: AC
  Filled 2014-03-20: qty 1

## 2014-03-20 SURGICAL SUPPLY — 46 items
APPLIER CLIP 5 13 M/L LIGAMAX5 (MISCELLANEOUS) ×3
APR CLP MED LRG 5 ANG JAW (MISCELLANEOUS) ×1
BAG HAMPER (MISCELLANEOUS) ×3 IMPLANT
BLADE SURG SZ11 CARB STEEL (BLADE) ×3 IMPLANT
CLIP APPLIE 5 13 M/L LIGAMAX5 (MISCELLANEOUS) ×1 IMPLANT
CLOTH BEACON ORANGE TIMEOUT ST (SAFETY) ×3 IMPLANT
COVER LIGHT HANDLE STERIS (MISCELLANEOUS) ×6 IMPLANT
DRAPE PROXIMA HALF (DRAPES) ×3 IMPLANT
ELECT REM PT RETURN 9FT ADLT (ELECTROSURGICAL) ×3
ELECTRODE REM PT RTRN 9FT ADLT (ELECTROSURGICAL) ×1 IMPLANT
FILTER SMOKE EVAC LAPAROSHD (FILTER) ×3 IMPLANT
FORMALIN 10 PREFIL 480ML (MISCELLANEOUS) ×3 IMPLANT
GAUZE SPONGE 4X4 16PLY XRAY LF (GAUZE/BANDAGES/DRESSINGS) IMPLANT
GLOVE BIOGEL M 7.0 STRL (GLOVE) ×3 IMPLANT
GLOVE BIOGEL PI IND STRL 7.0 (GLOVE) ×1 IMPLANT
GLOVE BIOGEL PI IND STRL 8 (GLOVE) ×1 IMPLANT
GLOVE BIOGEL PI INDICATOR 7.0 (GLOVE) ×2
GLOVE BIOGEL PI INDICATOR 8 (GLOVE) ×2
GLOVE ECLIPSE 8.0 STRL XLNG CF (GLOVE) ×3 IMPLANT
GLOVE EXAM NITRILE MD LF STRL (GLOVE) ×3 IMPLANT
GOWN STRL REUS W/TWL LRG LVL3 (GOWN DISPOSABLE) ×3 IMPLANT
GOWN STRL REUS W/TWL XL LVL3 (GOWN DISPOSABLE) ×3 IMPLANT
INST SET LAPROSCOPIC GYN AP (KITS) ×3 IMPLANT
IV NS IRRIG 3000ML ARTHROMATIC (IV SOLUTION) IMPLANT
KIT ROOM TURNOVER APOR (KITS) ×3 IMPLANT
MANIFOLD NEPTUNE II (INSTRUMENTS) ×3 IMPLANT
NEEDLE INSUFFLATION 14GA 120MM (NEEDLE) ×3 IMPLANT
PACK PERI GYN (CUSTOM PROCEDURE TRAY) ×3 IMPLANT
PAD ARMBOARD 7.5X6 YLW CONV (MISCELLANEOUS) ×3 IMPLANT
SCALPEL HARMONIC ACE (MISCELLANEOUS) ×3 IMPLANT
SET BASIN LINEN APH (SET/KITS/TRAYS/PACK) ×3 IMPLANT
SET TUBE IRRIG SUCTION NO TIP (IRRIGATION / IRRIGATOR) IMPLANT
SLEEVE ENDOPATH XCEL 5M (ENDOMECHANICALS) ×3 IMPLANT
SOLUTION ANTI FOG 6CC (MISCELLANEOUS) ×3 IMPLANT
SPONGE GAUZE 2X2 8PLY STER LF (GAUZE/BANDAGES/DRESSINGS) ×3
SPONGE GAUZE 2X2 8PLY STRL LF (GAUZE/BANDAGES/DRESSINGS) ×6 IMPLANT
STAPLER VISISTAT 35W (STAPLE) ×3 IMPLANT
SUT VICRYL 0 UR6 27IN ABS (SUTURE) ×3 IMPLANT
SYR 20CC LL (SYRINGE) ×3 IMPLANT
SYRINGE 10CC LL (SYRINGE) ×3 IMPLANT
TAPE CLOTH SURG 4X10 WHT LF (GAUZE/BANDAGES/DRESSINGS) ×3 IMPLANT
TRAY FOLEY CATH 16FR SILVER (SET/KITS/TRAYS/PACK) ×3 IMPLANT
TROCAR ENDO BLADELESS 11MM (ENDOMECHANICALS) ×3 IMPLANT
TROCAR XCEL NON-BLD 5MMX100MML (ENDOMECHANICALS) ×3 IMPLANT
TUBING INSUF HEATED (TUBING) ×3 IMPLANT
WARMER LAPAROSCOPE (MISCELLANEOUS) ×3 IMPLANT

## 2014-03-20 NOTE — Anesthesia Postprocedure Evaluation (Signed)
  Anesthesia Post-op Note Late entry Patient: Jamie Hendricks  Procedure(s) Performed: Procedure(s): LAPAROSCOPIC BILATERAL SALPINGECTOMY (Bilateral)  Patient Location: PACU  Anesthesia Type:General  Level of Consciousness: awake, alert , oriented and patient cooperative  Airway and Oxygen Therapy: Patient Spontanous Breathing  Post-op Pain: mild  Post-op Assessment: Post-op Vital signs reviewed, Patient's Cardiovascular Status Stable, Respiratory Function Stable, Patent Airway, No signs of Nausea or vomiting and Pain level controlled  Post-op Vital Signs: Reviewed and stable  Last Vitals:  Filed Vitals:   03/20/14 1136  BP: 119/81  Pulse: 64  Temp: 36.6 C  Resp: 16    Complications: No apparent anesthesia complications

## 2014-03-20 NOTE — Anesthesia Preprocedure Evaluation (Signed)
Anesthesia Evaluation  Patient identified by MRN, date of birth, ID band Patient awake    Reviewed: Allergy & Precautions, H&P , NPO status , Patient's Chart, lab work & pertinent test results  Airway Mallampati: I  TM Distance: >3 FB Neck ROM: full    Dental no notable dental hx. (+) Teeth Intact, Dental Advisory Given   Pulmonary former smoker,    Pulmonary exam normal       Cardiovascular negative cardio ROS      Neuro/Psych negative neurological ROS  negative psych ROS   GI/Hepatic negative GI ROS, Neg liver ROS,   Endo/Other  negative endocrine ROS  Renal/GU negative Renal ROS     Musculoskeletal   Abdominal Normal abdominal exam  (+)   Peds  Hematology negative hematology ROS (+)   Anesthesia Other Findings   Reproductive/Obstetrics (+) Pregnancy                             Anesthesia Physical Anesthesia Plan  ASA: I  Anesthesia Plan: General   Post-op Pain Management:    Induction: Intravenous  Airway Management Planned: Oral ETT  Additional Equipment:   Intra-op Plan:   Post-operative Plan: Extubation in OR  Informed Consent: I have reviewed the patients History and Physical, chart, labs and discussed the procedure including the risks, benefits and alternatives for the proposed anesthesia with the patient or authorized representative who has indicated his/her understanding and acceptance.     Plan Discussed with:   Anesthesia Plan Comments:         Anesthesia Quick Evaluation

## 2014-03-20 NOTE — Transfer of Care (Signed)
Immediate Anesthesia Transfer of Care Note  Patient: Jamie Hendricks  Procedure(s) Performed: Procedure(s): LAPAROSCOPIC BILATERAL SALPINGECTOMY (Bilateral)  Patient Location: PACU  Anesthesia Type:General  Level of Consciousness: awake, oriented and patient cooperative  Airway & Oxygen Therapy: Patient Spontanous Breathing and Patient connected to face mask oxygen  Post-op Assessment: Report given to RN and Post -op Vital signs reviewed and stable  Post vital signs: Reviewed and stable  Last Vitals:  Filed Vitals:   03/20/14 0935  BP: 96/58  Temp:   Resp: 12    Complications: No apparent anesthesia complications

## 2014-03-20 NOTE — Anesthesia Procedure Notes (Signed)
Procedure Name: Intubation Date/Time: 03/20/2014 9:51 AM Performed by: Pernell DupreADAMS, Nam Vossler A Pre-anesthesia Checklist: Patient identified, Patient being monitored, Timeout performed, Emergency Drugs available and Suction available Patient Re-evaluated:Patient Re-evaluated prior to inductionOxygen Delivery Method: Circle System Utilized Preoxygenation: Pre-oxygenation with 100% oxygen Intubation Type: IV induction Ventilation: Mask ventilation without difficulty Laryngoscope Size: 3 and Miller Grade View: Grade I Tube type: Oral Tube size: 7.0 mm Number of attempts: 1 Airway Equipment and Method: Stylet Placement Confirmation: ETT inserted through vocal cords under direct vision,  positive ETCO2 and breath sounds checked- equal and bilateral Secured at: 21 cm Tube secured with: Tape Dental Injury: Teeth and Oropharynx as per pre-operative assessment

## 2014-03-20 NOTE — Op Note (Signed)
Preoperative Diagnosis:  1.  [name] desires permanent sterilization                                          2.  Elects to have bilateral salpingectomy for ovarian cancer                                                     prophylaxis  Postoperative Diagnosis:  Same as above  Procedure:  Laparoscopic Bilateral Salpingectomy  Surgeon:  Rockne CoonsLuther H Lamiah Marmol  Jr MD  Anaesthesia: general  Findings:  Patient had normal pelvic anatomy and no intraperitoneal abnormalities.  Description of Operation:  Patient was taken to the OR and placed into supine position where she underwent general anaesthesia.  She was placed in the dorsal lithotomy position and prepped and draped in the usual sterile fashion.  An incision was made in the umbilicus and dissection taken down to the rectus fascia which was incised and opened.  The non bladed trocar was then placed and the peritoneal cavity was insufflated.  The above noted findings were observed.  Additional trocars were placed in the right and left lower quadrants under direct visualization without difficulty.  The Harmonic scalpel was employed and salpingectomy of both the right and left tubes was performed.   The tubes were removed from the peritoneal cavity and sent to pathology.  There was good hemostasis bilaterally.  The fascia, peritoneum and subcutaneous tissue were closed using 0 vicryl.  The skin was closed using staples.  Exparel 266 mg 20 cc was injected in the 3 incision trocar sites. The patient was awakened from anaesthesia and taken to the PACU with all counts being correct x 3.  The patient received  2 gram of ancef andToradol 30 mg IV preoperatively.  Kateleen Encarnacion H 03/20/2014 10:42 AM

## 2014-03-20 NOTE — H&P (Signed)
Preoperative History and Physical  Jamie Hendricks is a 24 y.o. [phone] with Patient's last menstrual period was 03/03/2014. admitted for a laparoscopic bilateral salpingectomy for sterilization.    PMH:  Past Medical History  Diagnosis Date  . Medical history non-contributory   . Supervision of other normal pregnancy 07/30/2013    PSH:  Past Surgical History  Procedure Laterality Date  . No past surgeries      POb/GynH:  OB History    Gravida Para Term Preterm AB TAB SAB Ectopic Multiple Living   0 3      SH:  History  Substance Use Topics  . Smoking status: Former Smoker -- 0.25 packs/day for 8 years    Types: Cigarettes  . Smokeless tobacco: Never Used  . Alcohol Use: No     Comment: social; not now    FH:  Family History  Problem Relation Age of Onset  . Seizures Other   . Hypertension Other   . Diabetes Other   . Diabetes Father   . Hypertension Maternal Grandmother   . Diabetes Paternal Grandfather      Allergies: No Known Allergies  Medications:  Current outpatient prescriptions:  . ferrous sulfate 325 (65 FE) MG tablet, Take 1 tablet (325 mg total) by mouth 2 (two) times daily with a meal. (Patient not taking: Reported on 03/14/2014), Disp: 60 tablet, Rfl: 3 . ibuprofen (ADVIL,MOTRIN) 600 MG tablet, Take 1 tablet (600 mg total) by mouth every 6 (six) hours as needed for mild pain, moderate pain or cramping. (Patient not taking: Reported on 03/14/2014), Disp: 30 tablet, Rfl: 0 . oxyCODONE-acetaminophen (PERCOCET/ROXICET) 5-325 MG per tablet, Take 1 tablet by mouth every 4 (four) hours as needed for severe pain. (Patient not taking: Reported on 03/07/2014), Disp: 10 tablet, Rfl: 0 . Pediatric Multivit-Minerals-C (FLINTSTONES GUMMIES PO), Take 2 tablets by mouth daily. , Disp: , Rfl:   Review of Systems:   Review of Systems   Constitutional: Negative for fever, chills, weight loss, malaise/fatigue and diaphoresis.  HENT: Negative for hearing loss, ear pain, nosebleeds, congestion, sore throat, neck pain, tinnitus and ear discharge.  Eyes: Negative for blurred vision, double vision, photophobia, pain, discharge and redness.  Respiratory: Negative for cough, hemoptysis, sputum production, shortness of breath, wheezing and stridor.  Cardiovascular: Negative for chest pain, palpitations, orthopnea, claudication, leg swelling and PND.  Gastrointestinal: Positive for abdominal pain. Negative for heartburn, nausea, vomiting, diarrhea, constipation, blood in stool and melena.  Genitourinary: Negative for dysuria, urgency, frequency, hematuria and flank pain.  Musculoskeletal: Negative for myalgias, back pain, joint pain and falls.  Skin: Negative for itching and rash.  Neurological: Negative for dizziness, tingling, tremors, sensory change, speech change, focal weakness, seizures, loss of consciousness, weakness and headaches.  Endo/Heme/Allergies: Negative for environmental allergies and polydipsia. Does not bruise/bleed easily.  Psychiatric/Behavioral: Negative for depression, suicidal ideas, hallucinations, memory loss and substance abuse. The patient is not nervous/anxious and does not have insomnia.     PHYSICAL EXAM:  Blood pressure 110/80, weight 126 lb (57.153 kg), last menstrual period 03/03/2014, not currently breastfeeding.   Vitals reviewed. Constitutional: She is oriented to person, place, and time. She appears well-developed and well-nourished.  HENT:  Head: Normocephalic and atraumatic.  Right Ear: External ear normal.  Left Ear: External ear normal.  Nose: Nose normal.  Mouth/Throat: Oropharynx is clear and moist.  Eyes: Conjunctivae and EOM are normal. Pupils are equal, round, and reactive to light. Right  eye exhibits no discharge. Left eye exhibits no discharge. No scleral icterus.  Neck:  Normal range of motion. Neck supple. No tracheal deviation present. No thyromegaly present.  Cardiovascular: Normal rate, regular rhythm, normal heart sounds and intact distal pulses. Exam reveals no gallop and no friction rub.  No murmur heard. Respiratory: Effort normal and breath sounds normal. No respiratory distress. She has no wheezes. She has no rales. She exhibits no tenderness.  GI: Soft. Bowel sounds are normal. She exhibits no distension and no mass. There is tenderness. There is no rebound and no guarding.  Genitourinary:   Vulva is normal without lesions Vagina is pink moist without discharge Cervix normal in appearance and pap is normal Uterus is normal Adnexa is negative with normal sized ovaries by sonogram  Musculoskeletal: Normal range of motion. She exhibits no edema and no tenderness.  Neurological: She is alert and oriented to person, place, and time. She has normal reflexes. She displays normal reflexes. No cranial nerve deficit. She exhibits normal muscle tone. Coordination normal.  Skin: Skin is warm and dry. No rash noted. No erythema. No pallor.  Psychiatric: She has a normal mood and affect. Her behavior is normal. Judgment and thought content normal.    Labs: No results found for this or any previous visit (from the past 336 hour(s)).  EKG: Orders placed or performed during the hospital encounter of 06/12/11  . EKG 12-Lead  . EKG 12-Lead  . EKG    Imaging Studies:  Imaging Results    No results found.      Assessment: [name] desires permanent sterilization Patient Active Problem List   Diagnosis Date Noted  . Pregnancy 01/31/2014  . Contraception tubal ligation at 4-6 wk pp 01/28/2014  . IUGR (intrauterine growth restriction) 11/27/2013  . Echogenic intracardiac focus of fetus on prenatal ultrasound 10/30/2013  . Preterm contractions 10/30/2013  . BV (bacterial vaginosis) 10/26/2013  . Two vessel  umbilical cord, antepartum 10/01/2013  . 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 H/O postpartum hemorrhage, currently pregnant 08/27/2013  . Smoker 08/27/2013  . Supervision of high risk pregnancy in third trimester 07/30/2013    Plan: Laparoscopic bilateral salpingectomy for sterilization  Jamie Hendricks H 03/20/2014 9:19 AM

## 2014-03-20 NOTE — Discharge Instructions (Signed)
Laparoscopic Tubal Ligation °Care After °Refer to this sheet in the next few weeks. These instructions provide you with information on caring for yourself after your procedure. Your caregiver may also give you more specific instructions. Your treatment has been planned according to current medical practices, but problems sometimes occur. Call your caregiver if you have any problems or questions after your procedure. °HOME CARE INSTRUCTIONS  °· Rest the remainder of the day. °· Only take over-the-counter or prescription medicines for pain, discomfort, or fever as directed by your caregiver. Do not take aspirin. It can cause bleeding. °· Gradually resume daily activities, diet, rest, driving, and work. °· Avoid sexual intercourse for 2 weeks or as directed. °· Do not use tampons or douche. °· Do not drive while taking pain medicine. °· Do not lift anything over 5 pounds for 2 weeks or as directed. °· Only take showers, not baths, until you are seen by your caregiver. °· Change bandages (dressings) as directed. °· Take your temperature twice a day and record it. °· Try to have help for the first 7 to 10 days for your household needs. °· Return to your caregiver to get your stitches (sutures) removed and for follow-up visits as directed. °SEEK MEDICAL CARE IF:  °· You have redness, swelling, or increasing pain in a wound. °· You have drainage from a wound lasting longer than 1 day. °· Your pain is getting worse. °· You have a rash. °· You become dizzy or lightheaded. °· You have a reaction to your medicine. °· You need stronger medicine or a change in your pain medicine. °· You notice a bad smell coming from a wound or dressing. °· Your wound breaks open after the sutures have been removed. °· You are constipated. °SEEK IMMEDIATE MEDICAL CARE IF:  °· You faint. °· You have a fever. °· You have increasing abdominal pain. °· You have severe pain in your shoulders. °· You have bleeding or drainage from the suture sites or  vagina following surgery. °· You have shortness of breath or difficulty breathing. °· You have chest or leg pain. °· You have persistent nausea, vomiting, or diarrhea. °MAKE SURE YOU:  °· Understand these instructions. °· Watch your condition. °· Get help right away if you are not doing well or get worse. °Document Released: 08/21/2004 Document Revised: 08/03/2011 Document Reviewed: 05/15/2011 °ExitCare® Patient Information ©2015 [employer]. This information is not intended to replace advice given to you by your health care provider. Make sure you discuss any questions you have with your health care provider. ° °

## 2014-03-21 ENCOUNTER — Encounter (HOSPITAL_COMMUNITY): Payer: Self-pay | Admitting: Obstetrics & Gynecology

## 2014-03-28 ENCOUNTER — Ambulatory Visit (INDEPENDENT_AMBULATORY_CARE_PROVIDER_SITE_OTHER): Payer: Medicaid Other | Admitting: Obstetrics & Gynecology

## 2014-03-28 ENCOUNTER — Encounter: Payer: Self-pay | Admitting: Obstetrics & Gynecology

## 2014-03-28 VITALS — BP 120/80 | Wt 130.0 lb

## 2014-03-28 DIAGNOSIS — Z9889 Other specified postprocedural states: Secondary | ICD-10-CM

## 2014-04-17 NOTE — Progress Notes (Signed)
Patient ID: Jamie Hendricks, female   DOB: 02-24-1990, 24 y.o.   MRN: [id number]  HPI: Patient returns for routine postoperative follow-up having undergone Laparoscopic salpingectomy  on 03/20/2014. The patient's early postoperative recovery while in the hospital was notable for unremarkable, outpatient. Since hospital discharge the patient reports no problems.   Current Outpatient Prescriptions  Medication Sig Dispense Refill  . acetaminophen (TYLENOL) 500 MG tablet Take 1,000 mg by mouth every 6 (six) hours as needed for mild pain or headache.    . ferrous sulfate 325 (65 FE) MG tablet Take 1 tablet (325 mg total) by mouth 2 (two) times daily with a meal. (Patient not taking: Reported on 03/14/2014) 60 tablet 3  . ibuprofen (ADVIL,MOTRIN) 600 MG tablet Take 1 tablet (600 mg total) by mouth every 6 (six) hours as needed for mild pain, moderate pain or cramping. (Patient not taking: Reported on 03/14/2014) 30 tablet 0  . ketorolac (TORADOL) 10 MG tablet Take 1 tablet (10 mg total) by mouth every 8 (eight) hours as needed. (Patient not taking: Reported on 03/28/2014) 15 tablet 0  . ondansetron (ZOFRAN) 8 MG tablet Take 1 tablet (8 mg total) by mouth every 8 (eight) hours as needed for nausea. (Patient not taking: Reported on 03/28/2014) 12 tablet 0  . oxyCODONE (ROXICODONE) 5 MG immediate release tablet Take 1 tablet (5 mg total) by mouth every 4 (four) hours as needed for severe pain. (Patient not taking: Reported on 03/28/2014) 30 tablet 0  . oxyCODONE-acetaminophen (PERCOCET/ROXICET) 5-325 MG per tablet Take 1 tablet by mouth every 4 (four) hours as needed for severe pain. (Patient not taking: Reported on 03/07/2014) 10 tablet 0   No current facility-administered medications for this visit.    Physical Exam: Incision x 3 normal staples removed Abdomen is benign normal post op  Diagnostic Tests: none  Impression: S/p laparoscopic salpingectomy for sterilization  Plan: Follow up yearly 6 months  or prn

## 2014-05-15 ENCOUNTER — Emergency Department (HOSPITAL_COMMUNITY): Payer: Self-pay

## 2014-05-15 ENCOUNTER — Emergency Department (HOSPITAL_COMMUNITY)
Admission: EM | Admit: 2014-05-15 | Discharge: 2014-05-15 | Disposition: A | Discharge status: Home / Self Care | Payer: Self-pay | Attending: Emergency Medicine | Admitting: Emergency Medicine

## 2014-05-15 ENCOUNTER — Encounter (HOSPITAL_COMMUNITY): Payer: Self-pay | Admitting: Emergency Medicine

## 2014-05-15 DIAGNOSIS — T07XXXA Unspecified multiple injuries, initial encounter: Secondary | ICD-10-CM

## 2014-05-15 DIAGNOSIS — K047 Periapical abscess without sinus: Secondary | ICD-10-CM

## 2014-05-15 DIAGNOSIS — K029 Dental caries, unspecified: Secondary | ICD-10-CM | POA: Insufficient documentation

## 2014-05-15 DIAGNOSIS — R040 Epistaxis: Secondary | ICD-10-CM | POA: Insufficient documentation

## 2014-05-15 DIAGNOSIS — S80211A Abrasion, right knee, initial encounter: Secondary | ICD-10-CM | POA: Insufficient documentation

## 2014-05-15 DIAGNOSIS — S20319A Abrasion of unspecified front wall of thorax, initial encounter: Secondary | ICD-10-CM | POA: Insufficient documentation

## 2014-05-15 DIAGNOSIS — S80212A Abrasion, left knee, initial encounter: Secondary | ICD-10-CM | POA: Insufficient documentation

## 2014-05-15 DIAGNOSIS — Y998 Other external cause status: Secondary | ICD-10-CM | POA: Insufficient documentation

## 2014-05-15 DIAGNOSIS — Y92410 Unspecified street and highway as the place of occurrence of the external cause: Secondary | ICD-10-CM | POA: Insufficient documentation

## 2014-05-15 DIAGNOSIS — S0081XA Abrasion of other part of head, initial encounter: Secondary | ICD-10-CM | POA: Insufficient documentation

## 2014-05-15 DIAGNOSIS — Z23 Encounter for immunization: Secondary | ICD-10-CM | POA: Insufficient documentation

## 2014-05-15 DIAGNOSIS — Z87891 Personal history of nicotine dependence: Secondary | ICD-10-CM | POA: Insufficient documentation

## 2014-05-15 DIAGNOSIS — Y9389 Activity, other specified: Secondary | ICD-10-CM | POA: Insufficient documentation

## 2014-05-15 MED ORDER — PENICILLIN V POTASSIUM 250 MG PO TABS
500.0000 mg | ORAL_TABLET | Freq: Once | ORAL | Status: AC
  Administered 2014-05-15: 500 mg via ORAL
  Filled 2014-05-15: qty 2

## 2014-05-15 MED ORDER — PENICILLIN V POTASSIUM 500 MG PO TABS: 500.0000 mg | ORAL_TABLET | Freq: Four times a day (QID) | ORAL | Status: DC

## 2014-05-15 MED ORDER — ONDANSETRON 4 MG PO TBDP
4.0000 mg | ORAL_TABLET | Freq: Once | ORAL | Status: AC
  Administered 2014-05-15: 4 mg via ORAL
  Filled 2014-05-15: qty 1

## 2014-05-15 MED ORDER — IBUPROFEN 800 MG PO TABS: 800.0000 mg | ORAL_TABLET | Freq: Three times a day (TID) | ORAL | Status: DC | PRN

## 2014-05-15 MED ORDER — OXYCODONE-ACETAMINOPHEN 5-325 MG PO TABS: 1.0000 | ORAL_TABLET | ORAL | Status: DC | PRN

## 2014-05-15 MED ORDER — PROMETHAZINE HCL 25 MG PO TABS: 25.0000 mg | ORAL_TABLET | Freq: Four times a day (QID) | ORAL | Status: DC | PRN

## 2014-05-15 MED ORDER — TETANUS-DIPHTH-ACELL PERTUSSIS 5-2.5-18.5 LF-MCG/0.5 IM SUSP
0.5000 mL | Freq: Once | INTRAMUSCULAR | Status: AC
  Administered 2014-05-15: 0.5 mL via INTRAMUSCULAR
  Filled 2014-05-15: qty 0.5

## 2014-05-15 MED ORDER — OXYCODONE-ACETAMINOPHEN 5-325 MG PO TABS
2.0000 | ORAL_TABLET | Freq: Once | ORAL | Status: AC
  Administered 2014-05-15: 2 via ORAL
  Filled 2014-05-15: qty 2

## 2014-05-15 MED ORDER — IBUPROFEN 800 MG PO TABS
800.0000 mg | ORAL_TABLET | Freq: Once | ORAL | Status: AC
  Administered 2014-05-15: 800 mg via ORAL
  Filled 2014-05-15: qty 1
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
## 2014-05-15 NOTE — ED Notes (Signed)
Pt reporting improvement in pain level.  No distress noted.  Denies needs at present time.

## 2014-09-26 ENCOUNTER — Encounter (HOSPITAL_COMMUNITY): Payer: Self-pay | Admitting: *Deleted

## 2014-09-26 ENCOUNTER — Emergency Department (HOSPITAL_COMMUNITY)
Admission: EM | Admit: 2014-09-26 | Discharge: 2014-09-26 | Disposition: A | Discharge status: Home / Self Care | Payer: No Typology Code available for payment source | Attending: Emergency Medicine | Admitting: Emergency Medicine

## 2014-09-26 DIAGNOSIS — N760 Acute vaginitis: Secondary | ICD-10-CM | POA: Insufficient documentation

## 2014-09-26 DIAGNOSIS — Z792 Long term (current) use of antibiotics: Secondary | ICD-10-CM | POA: Insufficient documentation

## 2014-09-26 DIAGNOSIS — Z87891 Personal history of nicotine dependence: Secondary | ICD-10-CM | POA: Insufficient documentation

## 2014-09-26 DIAGNOSIS — Z3202 Encounter for pregnancy test, result negative: Secondary | ICD-10-CM | POA: Insufficient documentation

## 2014-09-26 DIAGNOSIS — B9689 Other specified bacterial agents as the cause of diseases classified elsewhere: Secondary | ICD-10-CM

## 2014-09-26 LAB — CBC WITH DIFFERENTIAL/PLATELET
BASOS ABS: 0 10*3/uL (ref 0.0–0.1)
Basophils Relative: 1 % (ref 0–1)
Eosinophils Absolute: 0.2 10*3/uL (ref 0.0–0.7)
Eosinophils Relative: 3 % (ref 0–5)
HEMATOCRIT: 37.8 % (ref 36.0–46.0)
Hemoglobin: 12.4 g/dL (ref 12.0–15.0)
LYMPHS PCT: 31 % (ref 12–46)
Lymphs Abs: 2.2 10*3/uL (ref 0.7–4.0)
MCH: 28.5 pg (ref 26.0–34.0)
MCHC: 32.8 g/dL (ref 30.0–36.0)
MCV: 86.9 fL (ref 78.0–100.0)
MONO ABS: 0.5 10*3/uL (ref 0.1–1.0)
Monocytes Relative: 7 % (ref 3–12)
NEUTROS ABS: 4.2 10*3/uL (ref 1.7–7.7)
NEUTROS PCT: 58 % (ref 43–77)
PLATELETS: 340 10*3/uL (ref 150–400)
RBC: 4.35 MIL/uL (ref 3.87–5.11)
RDW: 14.6 % (ref 11.5–15.5)
WBC: 7.2 10*3/uL (ref 4.0–10.5)

## 2014-09-26 LAB — COMPREHENSIVE METABOLIC PANEL
ALBUMIN: 4.2 g/dL (ref 3.5–5.0)
ALK PHOS: 49 U/L (ref 38–126)
ALT: 23 U/L (ref 14–54)
AST: 22 U/L (ref 15–41)
Anion gap: 7 (ref 5–15)
BILIRUBIN TOTAL: 0.2 mg/dL — AB (ref 0.3–1.2)
BUN: 10 mg/dL (ref 6–20)
CHLORIDE: 102 mmol/L (ref 101–111)
CO2: 27 mmol/L (ref 22–32)
Calcium: 8.8 mg/dL — ABNORMAL LOW (ref 8.9–10.3)
Creatinine, Ser: 0.68 mg/dL (ref 0.44–1.00)
GFR calc non Af Amer: >60 mL/min (ref >60)
Glucose, Bld: 86 mg/dL (ref 65–99)
POTASSIUM: 4 mmol/L (ref 3.5–5.1)
Sodium: 136 mmol/L (ref 135–145)
Total Protein: 7.5 g/dL (ref 6.5–8.1)

## 2014-09-26 LAB — URINALYSIS, ROUTINE W REFLEX MICROSCOPIC
Bilirubin Urine: NEGATIVE
Glucose, UA: NEGATIVE mg/dL
KETONES UR: NEGATIVE mg/dL
LEUKOCYTES UA: NEGATIVE
NITRITE: NEGATIVE
Protein, ur: NEGATIVE mg/dL
Specific Gravity, Urine: 1.015 (ref 1.005–1.030)
Urobilinogen, UA: 0.2 mg/dL (ref 0.0–1.0)
pH: 6.5 (ref 5.0–8.0)

## 2014-09-26 LAB — WET PREP, GENITAL
Trich, Wet Prep: NONE SEEN
Yeast Wet Prep HPF POC: NONE SEEN

## 2014-09-26 LAB — URINE MICROSCOPIC-ADD ON

## 2014-09-26 LAB — PREGNANCY, URINE: PREG TEST UR: NEGATIVE

## 2014-09-26 LAB — LIPASE, BLOOD: Lipase: 19 U/L — ABNORMAL LOW (ref 22–51)

## 2014-09-26 MED ORDER — METRONIDAZOLE 500 MG PO TABS: 500.0000 mg | ORAL_TABLET | Freq: Two times a day (BID) | ORAL | Status: DC

## 2014-09-26 NOTE — ED Notes (Signed)
Lab at bedside

## 2014-09-26 NOTE — ED Notes (Signed)
Pt made aware of need for urine specimen 

## 2014-09-26 NOTE — ED Notes (Signed)
Pelvic exam completed with nurse at bedside

## 2014-09-26 NOTE — ED Notes (Signed)
Pt c/o bilateral lower abdominal pain and foul vaginal odor x 2 days. Vaginal discharge is clear. Pain rated at 7/10 as aching and burning. Pt denies fever, chills, nausea, vomiting, trouble with urination or bowel movements.

## 2014-09-26 NOTE — ED Provider Notes (Signed)
CSN: [id number]     Arrival date & time 09/26/14  0715 History   First MD Initiated Contact with Patient 09/26/14 [phone]     Chief Complaint  Patient presents with  . Abdominal Pain    HPI  Jamie Hendricks is a 24yo female presenting today for abdominal pain. Notes bilateral lower abdominal pain and foul vaginal odor x2 days. Vaginal discharge first started 2-3 weeks ago. It is clear and is sometimes thin and watery, but occasionally thick. Discharge has odor, which she describes as fishy. Describes pain as aching and burning and in bilateral lower quadrants. Pain has been present x2 days. Last sexually active 2-3 weeks ago and did not use protection. Monogamous. Denies fever, chills, nausea, vomiting, dysuria, frequency, urgency, constipation, diarrhea. Denies vaginal bleeding, itchiness, or genital sores.  History of laparoscopic salpingectomy in 03/2014 for sterilization. History of three pregnancies and three deliveries without complication. History of treatment for PID at previous ED visit in 03/2013, however no positive GC/Chlamydia results noted in chart. LMP last week.  Past Medical History  Diagnosis Date  . Medical history non-contributory   . Supervision of other normal pregnancy 07/30/2013   Past Surgical History  Procedure Laterality Date  . No past surgeries    . Laparoscopic bilateral salpingectomy Bilateral 03/20/2014    Procedure: LAPAROSCOPIC BILATERAL SALPINGECTOMY;  Surgeon: Lazaro Arms, MD;  Location: [facility];  Service: Gynecology;  Laterality: Bilateral;   Family History  Problem Relation Age of Onset  . Seizures Other   . Hypertension Other   . Diabetes Other   . Diabetes Father   . Hypertension Maternal Grandmother   . Diabetes Paternal Grandfather    Social History  Substance Use Topics  . Smoking status: Former Smoker -- 0.25 packs/day for 8 years    Types: Cigarettes  . Smokeless tobacco: Never Used  . Alcohol Use: Yes     Comment: social;    OB History    Gravida Para Term Preterm AB TAB SAB Ectopic Multiple Living   3 3 3       0 3     Review of Systems  Constitutional: Negative for fever and appetite change.  Gastrointestinal: Positive for abdominal pain. Negative for vomiting, diarrhea and constipation.  Genitourinary: Negative for dysuria, urgency, frequency and vaginal bleeding.      Allergies  Review of patient's allergies indicates no known allergies.  Home Medications   Prior to Admission medications   Medication Sig Start Date End Date Taking? Authorizing Provider  ibuprofen (ADVIL,MOTRIN) 800 MG tablet Take 1 tablet (800 mg total) by mouth every 8 (eight) hours as needed for mild pain. 05/15/14   Kristen N Ward, DO  oxyCODONE-acetaminophen (PERCOCET/ROXICET) 5-325 MG per tablet Take 1 tablet by mouth every 4 (four) hours as needed. 05/15/14   Kristen N Ward, DO  penicillin v potassium (VEETID) 500 MG tablet Take 1 tablet (500 mg total) by mouth 4 (four) times daily. 05/15/14   Kristen N Ward, DO  promethazine (PHENERGAN) 25 MG tablet Take 1 tablet (25 mg total) by mouth every 6 (six) hours as needed for nausea or vomiting. 05/15/14   Layla Maw Ward, DO   There were no vitals taken for this visit. Physical Exam  Constitutional: She appears well-developed and well-nourished. No distress.  HENT:  Head: Normocephalic and atraumatic.  Cardiovascular: Normal rate and regular rhythm.  Exam reveals no gallop and no friction rub.   No murmur heard. Pulmonary/Chest: Effort normal. No respiratory distress. She  has no wheezes.  Abdominal: Soft. She exhibits no distension and no mass. There is no rebound and no guarding.  Tenderness to palpation in RLQ and LLQ. Mild tenderness in RUQ. Mildly positive Murphy's. Negative Rovsings.   Genitourinary:  No genital lesions noted. Clear vaginal discharge noted. No cervical or vaginal lesions noted on speculum exam. No tenderness noted with bimanual exam. Negative [place].   Musculoskeletal: She  exhibits no edema.  Skin: Skin is warm and dry. No rash noted.  Psychiatric: She has a normal mood and affect. Her behavior is normal.    ED Course  Procedures (including critical care time) Labs Review Labs Reviewed  PREGNANCY, URINE  URINALYSIS, ROUTINE W REFLEX MICROSCOPIC (NOT AT [facility])    Imaging Review No results found.   EKG Interpretation None      MDM   Final diagnoses:  None  Will obtain wet prep, GC/Chlamydia, urinalysis, urine pregnancy, CBC, CMP, and lipase. CBC normal. Wet prep with few clue cells and few WBC and no yeast or trichomonas. Pregnancy negative. Urinalysis with trace hemoglobin, otherwise normal. Lipase slightly low at 19. CMP within normal limits. Symptoms most likely consistent with mild Bacterial Vaginosis. Stable for discharge with prescriptions for Metronidazole and Bentyl. Follow up with PCP.    Lora Havens [place], [place] 10/04/14 1241  Glynn Octave, MD 10/04/14 [phone]

## 2014-09-26 NOTE — Discharge Instructions (Signed)
Bacterial Vaginosis Bacterial vaginosis is a vaginal infection that occurs when the normal balance of bacteria in the vagina is disrupted. It results from an overgrowth of certain bacteria. This is the most common vaginal infection in women of childbearing age. Treatment is important to prevent complications, especially in pregnant women, as it can cause a premature delivery. CAUSES  Bacterial vaginosis is caused by an increase in harmful bacteria that are normally present in smaller amounts in the vagina. Several different kinds of bacteria can cause bacterial vaginosis. However, the reason that the condition develops is not fully understood. RISK FACTORS Certain activities or behaviors can put you at an increased risk of developing bacterial vaginosis, including:  Having a new sex partner or multiple sex partners.  Douching.  Using an intrauterine device (IUD) for contraception. Women do not get bacterial vaginosis from toilet seats, bedding, swimming pools, or contact with objects around them. SIGNS AND SYMPTOMS  Some women with bacterial vaginosis have no signs or symptoms. Common symptoms include:  Grey vaginal discharge.  A fishlike odor with discharge, especially after sexual intercourse.  Itching or burning of the vagina and vulva.  Burning or pain with urination. DIAGNOSIS  Your health care provider will take a medical history and examine the vagina for signs of bacterial vaginosis. A sample of vaginal fluid may be taken. Your health care provider will look at this sample under a microscope to check for bacteria and abnormal cells. A vaginal pH test may also be done.  TREATMENT  Bacterial vaginosis may be treated with antibiotic medicines. These may be given in the form of a pill or a vaginal cream. A second round of antibiotics may be prescribed if the condition comes back after treatment.  HOME CARE INSTRUCTIONS   Only take over-the-counter or prescription medicines as  directed by your health care provider.  If antibiotic medicine was prescribed, take it as directed. Make sure you finish it even if you start to feel better.  Do not have sex until treatment is completed.  Tell all sexual partners that you have a vaginal infection. They should see their health care provider and be treated if they have problems, such as a mild rash or itching.  Practice safe sex by using condoms and only having one sex partner. SEEK MEDICAL CARE IF:   Your symptoms are not improving after 3 days of treatment.  You have increased discharge or pain.  You have a fever. MAKE SURE YOU:   Understand these instructions.  Will watch your condition.  Will get help right away if you are not doing well or get worse. FOR MORE INFORMATION  Centers for Disease Control and Prevention, Division of STD Prevention: www.cdc.gov/std American Sexual Health Association (ASHA): www.ashastd.org  Document Released: 02/01/2005 Document Revised: 11/22/2012 Document Reviewed: 09/13/2012 [employer] Patient Information 2015 [employer]. This information is not intended to replace advice given to you by your health care provider. Make sure you discuss any questions you have with your health care provider.  

## 2014-09-27 LAB — GC/CHLAMYDIA PROBE AMP (~~LOC~~) NOT AT ARMC
Chlamydia: NEGATIVE
NEISSERIA GONORRHEA: NEGATIVE

## 2014-09-29 NOTE — ED Provider Notes (Signed)
I saw and evaluated the patient, reviewed the resident's note and I agree with the findings and plan. If applicable, I agree with the resident's interpretation of the EKG.  If applicable, I was present for critical portions of any procedures performed.  Lower abdominal pain with vaginal odor x 2 days.  Clear discharge. No vomiting or fever.  No urinary symptoms. Nontoxic.  CTAB, RRR Abdomen soft, mild suprapubic tenderness. No pain at McBurney's point.  UA, HCG, pelvic.  Glynn Octave, MD 09/29/14 [phone]

## 2014-10-08 IMAGING — US US MFM OB TRANSVAGINAL
1 series · 14 of 14 positions shown · non-contrast
Comparison: none

[Series 1: us mfm ob transvaginal · 0.19mm/px · 14 acquisitions, 14 frames shown]
[im 1/14]
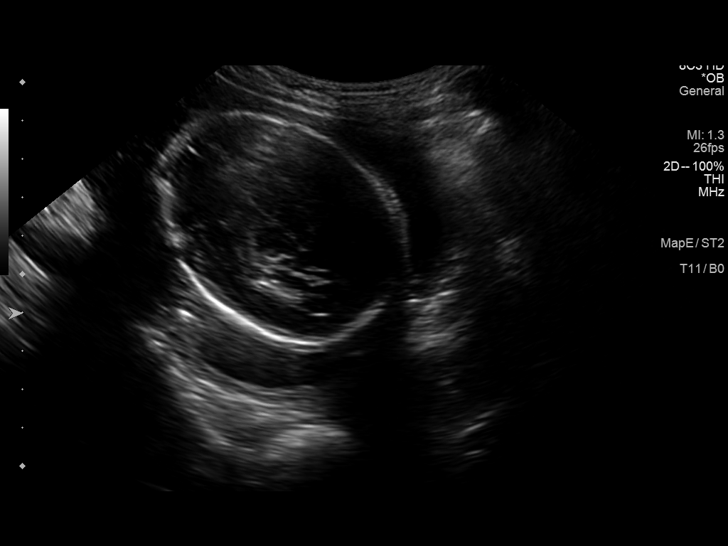
[im 2/14]
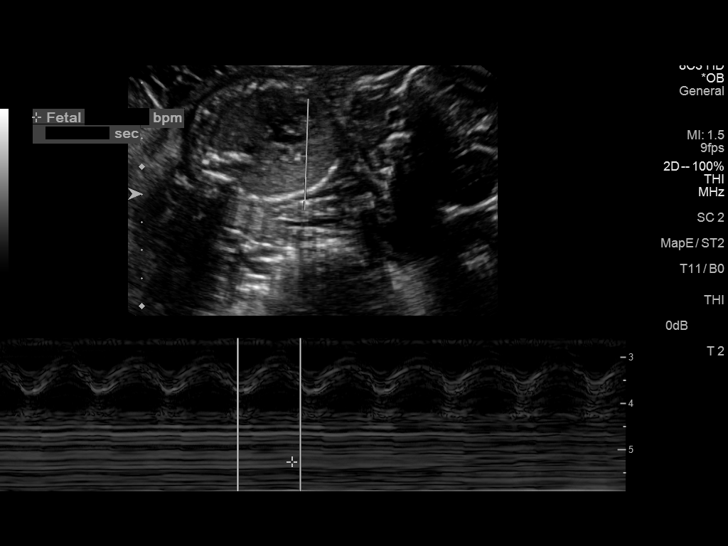
[im 3/14]
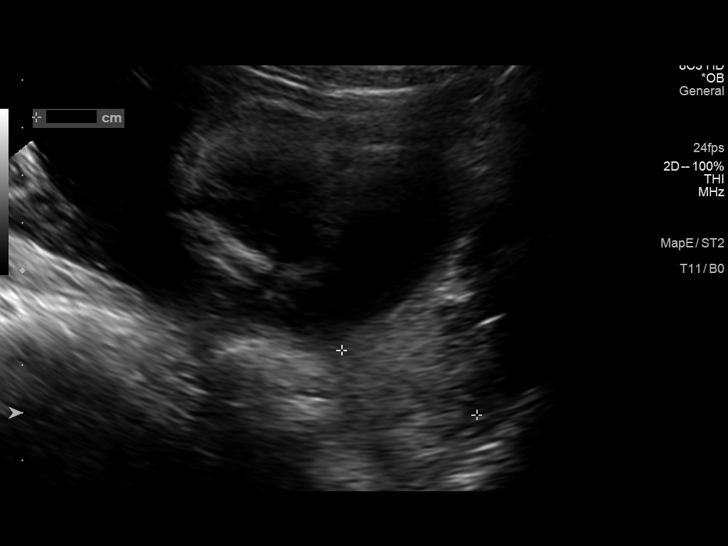
[im 4/14]
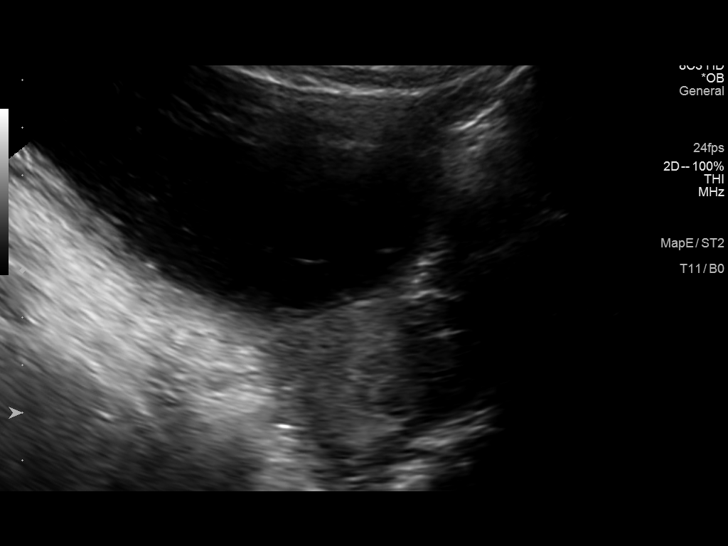
[im 5/14]
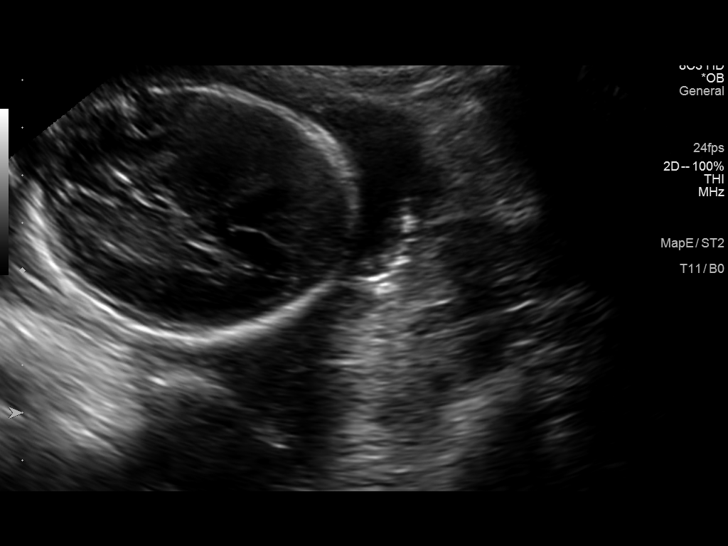
[im 6/14]
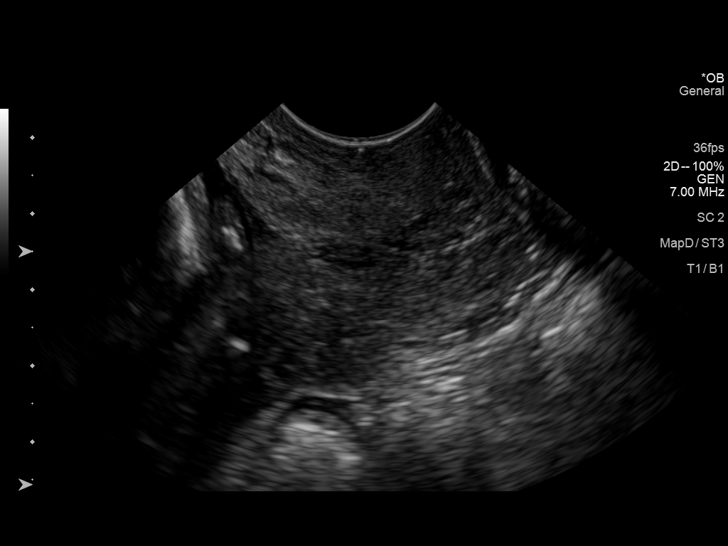
[im 7/14]
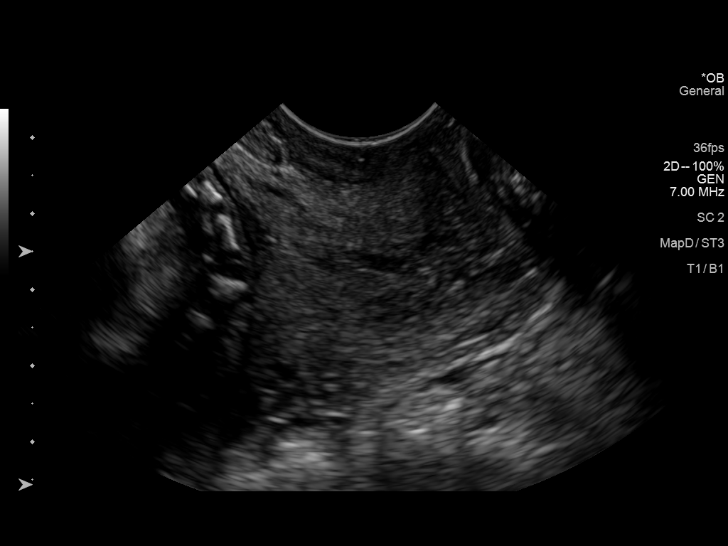
[im 8/14]
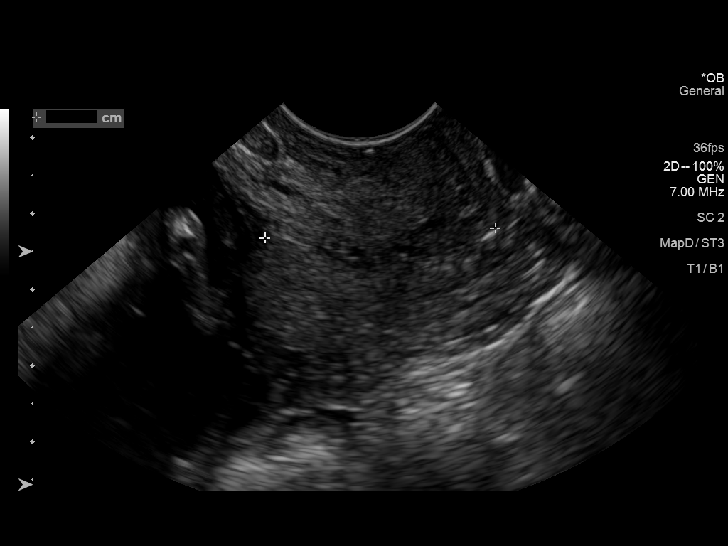
[im 9/14]
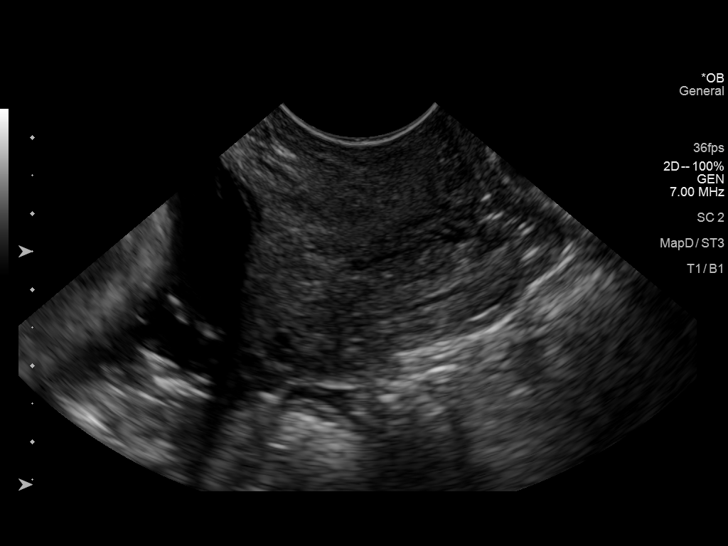
[im 10/14]
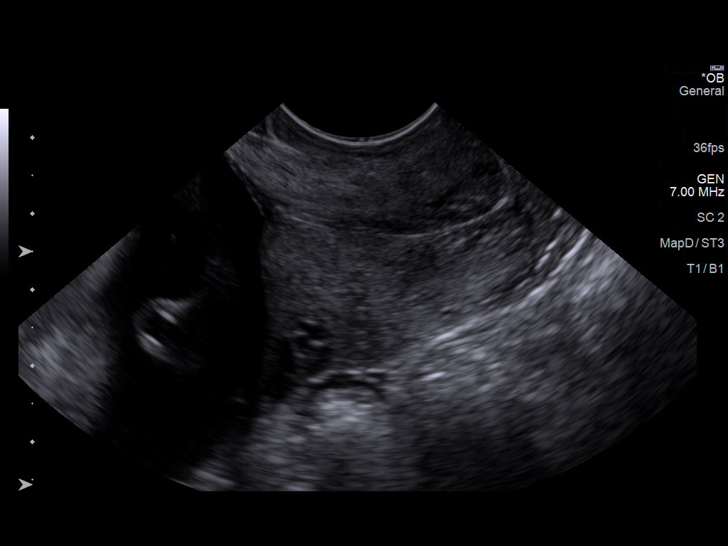
[im 11/14]
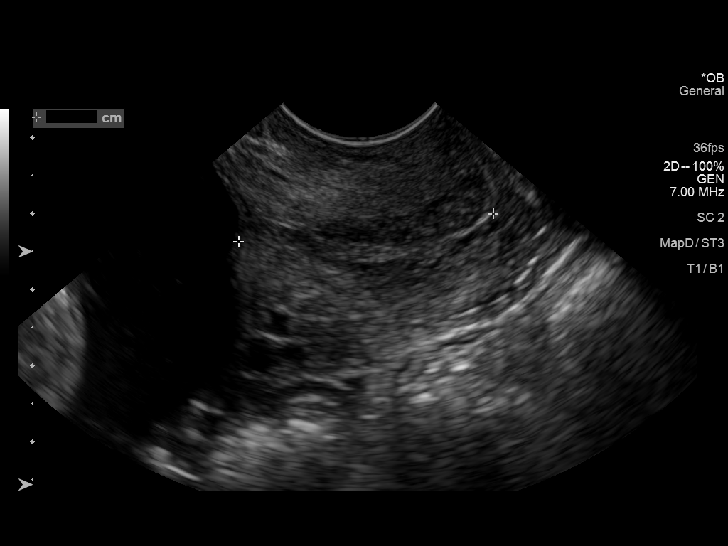
[im 12/14]
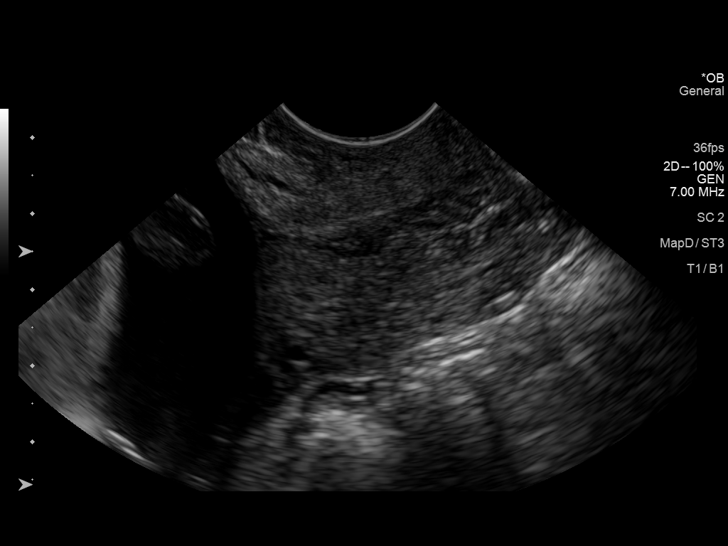
[im 13/14]
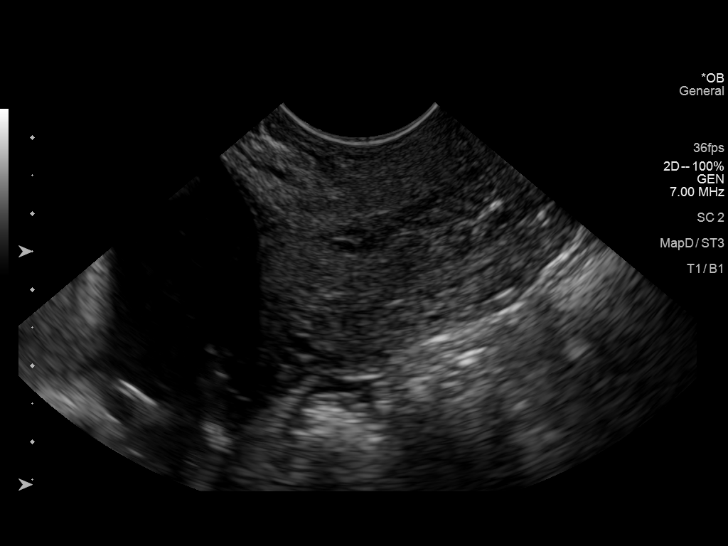
[im 14/14]
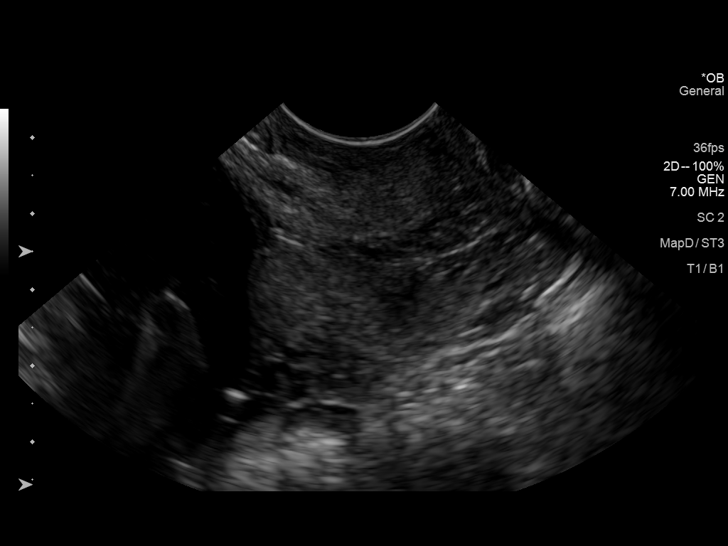

[14 of 14 positions shown; findings below may reference images not displayed]

OBSTETRICS REPORT
                      (Signed Final 10/24/2013 [DATE])

                                                         MAU/Triage
Service(s) Provided

 US MFM OB TRANSVAGINAL                                76817.2
Indications

 Pain - Abdominal/Pelvic
Fetal Evaluation

 Num Of Fetuses:    1
 Fetal Heart Rate:  163                          bpm
 Cardiac Activity:  Observed
 Presentation:      Cephalic
Gestational Age

 Best:          23w 0d     Det. By:  Early Ultrasound         EDD:   02/20/14
Cervix Uterus Adnexa

 Cervical Length:    3.4      cm

 Cervix:       Normal appearance by transvaginal scan
Impression

 Single IUP at 23w 0d
 Transvaginal ultrasound performed due to abdominal / pelvic
 pain
 Cervical length 3.4 cm without funneling or dynamic changes
 Cephalic presentation
Recommendations

 Follow-up ultrasounds as clinically indicated.

 questions or concerns.

## 2014-10-28 ENCOUNTER — Encounter (HOSPITAL_COMMUNITY): Payer: Self-pay

## 2014-10-28 ENCOUNTER — Emergency Department (HOSPITAL_COMMUNITY)
Admission: EM | Admit: 2014-10-28 | Discharge: 2014-10-28 | Disposition: A | Discharge status: Home / Self Care | Payer: Self-pay | Attending: Emergency Medicine | Admitting: Emergency Medicine

## 2014-10-28 DIAGNOSIS — Z87891 Personal history of nicotine dependence: Secondary | ICD-10-CM | POA: Insufficient documentation

## 2014-10-28 DIAGNOSIS — Z87898 Personal history of other specified conditions: Secondary | ICD-10-CM | POA: Insufficient documentation

## 2014-10-28 DIAGNOSIS — N760 Acute vaginitis: Secondary | ICD-10-CM | POA: Insufficient documentation

## 2014-10-28 DIAGNOSIS — Z3202 Encounter for pregnancy test, result negative: Secondary | ICD-10-CM | POA: Insufficient documentation

## 2014-10-28 DIAGNOSIS — B9689 Other specified bacterial agents as the cause of diseases classified elsewhere: Secondary | ICD-10-CM

## 2014-10-28 LAB — URINALYSIS, ROUTINE W REFLEX MICROSCOPIC
Bilirubin Urine: NEGATIVE
Glucose, UA: NEGATIVE mg/dL
KETONES UR: NEGATIVE mg/dL
LEUKOCYTES UA: NEGATIVE
NITRITE: NEGATIVE
PROTEIN: NEGATIVE mg/dL
Specific Gravity, Urine: 1.02 (ref 1.005–1.030)
UROBILINOGEN UA: 0.2 mg/dL (ref 0.0–1.0)
pH: 6.5 (ref 5.0–8.0)

## 2014-10-28 LAB — URINE MICROSCOPIC-ADD ON

## 2014-10-28 LAB — PREGNANCY, URINE: PREG TEST UR: NEGATIVE

## 2014-10-28 LAB — WET PREP, GENITAL
Trich, Wet Prep: NONE SEEN
YEAST WET PREP: NONE SEEN

## 2014-10-28 MED ORDER — METRONIDAZOLE 500 MG PO TABS: 500.0000 mg | ORAL_TABLET | Freq: Two times a day (BID) | ORAL | Status: DC

## 2014-10-28 NOTE — ED Provider Notes (Signed)
CSN: [id number]     Arrival date & time 10/28/14  0945 History  This chart was scribed for non-physician practitioner, Burgess Amor, PA-C working with Azalia Bilis, MD, by Jarvis Morgan, ED Scribe. This patient was seen in room APFT20/[facility] and the patient's care was started at 12:08 PM.       Chief Complaint  Patient presents with  . Pelvic Pain    The history is provided by the patient. No language interpreter was used.    HPI Comments: Jamie Hendricks is a 24 y.o. female who presents to the Emergency Department complaining of constant, mild, "cramping', pelvic pain onset 3 days. She reports associated abnormal vaginal odor w/o vaginal discharge. Pt has concern for bacterial vaginonsis and states she has had this in the past with similar symptoms. She endorses that she is sexually active with a new partner. She admits to condom use. She states her partner has not reported any recent symptoms. Her LNMP was 10/14/14 and she reports her menstrual periods are normal. She denies any recent antibiotic use. She notes she had STD screening done 2 months ago which came back negative. Pt does not have a PCP or gynecologist that she follows up with regularly. She denies any dysuria, fever, nausea or vomiting.  She has found no alleviators for her symptoms.   Past Medical History  Diagnosis Date  . Medical history non-contributory   . Supervision of other normal pregnancy 07/30/2013   Past Surgical History  Procedure Laterality Date  . No past surgeries    . Laparoscopic bilateral salpingectomy Bilateral 03/20/2014    Procedure: LAPAROSCOPIC BILATERAL SALPINGECTOMY;  Surgeon: Lazaro Arms, MD;  Location: [facility];  Service: Gynecology;  Laterality: Bilateral;   Family History  Problem Relation Age of Onset  . Seizures Other   . Hypertension Other   . Diabetes Other   . Diabetes Father   . Hypertension Maternal Grandmother   . Diabetes Paternal Grandfather    Social History  Substance Use Topics   . Smoking status: Former Smoker -- 0.25 packs/day for 8 years    Types: Cigarettes  . Smokeless tobacco: Never Used  . Alcohol Use: Yes     Comment: social;    OB History    Gravida Para Term Preterm AB TAB SAB Ectopic Multiple Living   3 3 3       0 3     Review of Systems  Constitutional: Negative for fever and chills.  Gastrointestinal: Negative for nausea and vomiting.  Genitourinary: Positive for pelvic pain. Negative for dysuria, vaginal discharge, difficulty urinating and menstrual problem.      Allergies  Review of patient's allergies indicates no known allergies.  Home Medications   Prior to Admission medications   Medication Sig Start Date End Date Taking? Authorizing Provider  diphenhydramine-acetaminophen (TYLENOL PM) 25-500 MG TABS Take 1 tablet by mouth at bedtime as needed (sleep).     Historical Provider, MD  ibuprofen (ADVIL,MOTRIN) 800 MG tablet Take 1 tablet (800 mg total) by mouth every 8 (eight) hours as needed for mild pain. Patient not taking: Reported on 09/26/2014 05/15/14   Kristen N Ward, DO  metroNIDAZOLE (FLAGYL) 500 MG tablet Take 1 tablet (500 mg total) by mouth 2 (two) times daily. 10/28/14   Burgess Amor, PA-C  oxyCODONE-acetaminophen (PERCOCET/ROXICET) 5-325 MG per tablet Take 1 tablet by mouth every 4 (four) hours as needed. Patient not taking: Reported on 09/26/2014 05/15/14   Layla Maw Ward, DO  penicillin v  potassium (VEETID) 500 MG tablet Take 1 tablet (500 mg total) by mouth 4 (four) times daily. Patient not taking: Reported on 09/26/2014 05/15/14   Kristen N Ward, DO  promethazine (PHENERGAN) 25 MG tablet Take 1 tablet (25 mg total) by mouth every 6 (six) hours as needed for nausea or vomiting. Patient not taking: Reported on 09/26/2014 05/15/14   Layla Maw Ward, DO   Triage Vitals: BP 118/85 mmHg  Pulse 94  Temp(Src) 98.6 F (37 C) (Oral)  Resp 18  Ht 5' (1.524 m)  Wt 130 lb (58.968 kg)  BMI 25.39 kg/m2  SpO2 100%  LMP  10/14/2014  Physical Exam  Constitutional: She appears well-developed and well-nourished.  HENT:  Head: Normocephalic and atraumatic.  Eyes: Conjunctivae are normal.  Neck: Normal range of motion.  Cardiovascular: Normal rate, regular rhythm, normal heart sounds and intact distal pulses.   Pulmonary/Chest: Effort normal and breath sounds normal. She has no wheezes.  Abdominal: Soft. Bowel sounds are normal. There is no tenderness.  Genitourinary: Uterus normal. Cervix exhibits no motion tenderness and no discharge. Right adnexum displays no mass, no tenderness and no fullness. Left adnexum displays no mass, no tenderness and no fullness. No erythema, tenderness or bleeding in the vagina. Vaginal discharge found.  Adnexa is non tender.  Opaque thin copious vaginal dc.  Musculoskeletal: Normal range of motion.  Neurological: She is alert.  Skin: Skin is warm and dry.  Psychiatric: She has a normal mood and affect.  Nursing note and vitals reviewed.   ED Course  Procedures (including critical care time)  DIAGNOSTIC STUDIES: Oxygen Saturation is 100% on RA, normal by my interpretation.    COORDINATION OF CARE:    Labs Review Labs Reviewed  WET PREP, GENITAL - Abnormal; Notable for the following:    Clue Cells Wet Prep HPF POC MANY (*)    WBC, Wet Prep HPF POC FEW (*)    All other components within normal limits  URINALYSIS, ROUTINE W REFLEX MICROSCOPIC (NOT AT [facility]) - Abnormal; Notable for the following:    Hgb urine dipstick TRACE (*)    All other components within normal limits  URINE MICROSCOPIC-ADD ON - Abnormal; Notable for the following:    Squamous Epithelial / LPF FEW (*)    Bacteria, UA FEW (*)    All other components within normal limits  PREGNANCY, URINE  GC/CHLAMYDIA PROBE AMP ([place]) NOT AT [facility]    Imaging Review No results found. I have personally reviewed and evaluated these images and lab results as part of my medical decision-making.   EKG  Interpretation None      MDM   Final diagnoses:  Bacterial vaginosis    Patients  labs reviewed.    Results were also discussed with patient.  Discussed prophylaxis tx for gc/chlamydia with pending cx.  Pt defers, will wait on results, given nontender pelvic exam, doubt cervicitis at this time.  Flagyl prescribed for bacterial vaginosis.  Advised prn f/u with any persistent or worsened sx.  Pt defers hiv, syphilis testing, had testing 2 months ago and negative.  I personally performed the services described in this documentation, which was scribed in my presence. The recorded information has been reviewed and is accurate.     Burgess Amor, PA-C 10/29/14 1610  Azalia Bilis, MD 10/29/14 [phone]

## 2014-10-28 NOTE — ED Notes (Signed)
Pt reports pelvic pain and vaginal odor x 3 days.  Denies vaginal discharge or painful urination.

## 2014-10-28 NOTE — Discharge Instructions (Signed)
Bacterial Vaginosis Bacterial vaginosis is a vaginal infection that occurs when the normal balance of bacteria in the vagina is disrupted. It results from an overgrowth of certain bacteria. This is the most common vaginal infection in women of childbearing age. Treatment is important to prevent complications, especially in pregnant women, as it can cause a premature delivery. CAUSES  Bacterial vaginosis is caused by an increase in harmful bacteria that are normally present in smaller amounts in the vagina. Several different kinds of bacteria can cause bacterial vaginosis. However, the reason that the condition develops is not fully understood. RISK FACTORS Certain activities or behaviors can put you at an increased risk of developing bacterial vaginosis, including:  Having a new sex partner or multiple sex partners.  Douching.  Using an intrauterine device (IUD) for contraception. Women do not get bacterial vaginosis from toilet seats, bedding, swimming pools, or contact with objects around them. SIGNS AND SYMPTOMS  Some women with bacterial vaginosis have no signs or symptoms. Common symptoms include:  Grey vaginal discharge.  A fishlike odor with discharge, especially after sexual intercourse.  Itching or burning of the vagina and vulva.  Burning or pain with urination. DIAGNOSIS  Your health care provider will take a medical history and examine the vagina for signs of bacterial vaginosis. A sample of vaginal fluid may be taken. Your health care provider will look at this sample under a microscope to check for bacteria and abnormal cells. A vaginal pH test may also be done.  TREATMENT  Bacterial vaginosis may be treated with antibiotic medicines. These may be given in the form of a pill or a vaginal cream. A second round of antibiotics may be prescribed if the condition comes back after treatment.  HOME CARE INSTRUCTIONS   Only take over-the-counter or prescription medicines as  directed by your health care provider.  If antibiotic medicine was prescribed, take it as directed. Make sure you finish it even if you start to feel better.  Do not have sex until treatment is completed.  Tell all sexual partners that you have a vaginal infection. They should see their health care provider and be treated if they have problems, such as a mild rash or itching.  Practice safe sex by using condoms and only having one sex partner. SEEK MEDICAL CARE IF:   Your symptoms are not improving after 3 days of treatment.  You have increased discharge or pain.  You have a fever. MAKE SURE YOU:   Understand these instructions.  Will watch your condition.  Will get help right away if you are not doing well or get worse. FOR MORE INFORMATION  Centers for Disease Control and Prevention, Division of STD Prevention: www.cdc.gov/std American Sexual Health Association (ASHA): www.ashastd.org  Document Released: 02/01/2005 Document Revised: 11/22/2012 Document Reviewed: 09/13/2012 [employer] Patient Information 2015 [employer]. This information is not intended to replace advice given to you by your health care provider. Make sure you discuss any questions you have with your health care provider.  

## 2014-10-29 LAB — GC/CHLAMYDIA PROBE AMP (~~LOC~~) NOT AT ARMC
Chlamydia: NEGATIVE
Neisseria Gonorrhea: NEGATIVE

## 2014-11-22 ENCOUNTER — Encounter (HOSPITAL_COMMUNITY): Payer: Self-pay | Admitting: Emergency Medicine

## 2014-11-22 ENCOUNTER — Emergency Department (HOSPITAL_COMMUNITY)
Admission: EM | Admit: 2014-11-22 | Discharge: 2014-11-22 | Disposition: A | Discharge status: Home / Self Care | Payer: Self-pay | Attending: Emergency Medicine | Admitting: Emergency Medicine

## 2014-11-22 DIAGNOSIS — Z3202 Encounter for pregnancy test, result negative: Secondary | ICD-10-CM | POA: Insufficient documentation

## 2014-11-22 DIAGNOSIS — Z8619 Personal history of other infectious and parasitic diseases: Secondary | ICD-10-CM | POA: Insufficient documentation

## 2014-11-22 DIAGNOSIS — Z792 Long term (current) use of antibiotics: Secondary | ICD-10-CM | POA: Insufficient documentation

## 2014-11-22 DIAGNOSIS — N898 Other specified noninflammatory disorders of vagina: Secondary | ICD-10-CM | POA: Insufficient documentation

## 2014-11-22 DIAGNOSIS — Z87891 Personal history of nicotine dependence: Secondary | ICD-10-CM | POA: Insufficient documentation

## 2014-11-22 LAB — WET PREP, GENITAL
TRICH WET PREP: NONE SEEN
YEAST WET PREP: NONE SEEN

## 2014-11-22 LAB — PREGNANCY, URINE: Preg Test, Ur: NEGATIVE

## 2014-11-22 MED ORDER — METRONIDAZOLE 0.75 % EX GEL: 1.0000 "application " | Freq: Two times a day (BID) | CUTANEOUS | Status: DC

## 2014-11-22 NOTE — ED Notes (Signed)
Patient with no complaints at this time. Respirations even and unlabored. Skin warm/dry. Discharge instructions reviewed with patient at this time. Patient given opportunity to voice concerns/ask questions. Patient discharged at this time and left Emergency Department with steady gait.   

## 2014-11-22 NOTE — ED Notes (Signed)
Notice vaginal odor on Sunday.  Denies any vaginal discharge or any discomfort.

## 2014-11-22 NOTE — ED Provider Notes (Signed)
CSN: [id number]     Arrival date & time 11/22/14  0714 History   First MD Initiated Contact with Patient 11/22/14 [phone]     Chief Complaint  Patient presents with  . Vaginal Discharge     (Consider location/radiation/quality/duration/timing/severity/associated sxs/prior Treatment) HPI  24 year old female presents with recurrent vaginal odor or the last 5 days. Last menstrual cycle ended 1 day before this and then had recurrent odor. Denies any discharge. States this is similar to multiple prior BV episodes. Denies abdominal pain, current vaginal bleeding, or dysuria. Patient has not seen a gynecologist. Has been with the same partner and has low suspicion for GC/Chlamydia.  Past Medical History  Diagnosis Date  . Medical history non-contributory   . Supervision of other normal pregnancy 07/30/2013   Past Surgical History  Procedure Laterality Date  . No past surgeries    . Laparoscopic bilateral salpingectomy Bilateral 03/20/2014    Procedure: LAPAROSCOPIC BILATERAL SALPINGECTOMY;  Surgeon: Lazaro Arms, MD;  Location: [facility];  Service: Gynecology;  Laterality: Bilateral;   Family History  Problem Relation Age of Onset  . Seizures Other   . Hypertension Other   . Diabetes Other   . Diabetes Father   . Hypertension Maternal Grandmother   . Diabetes Paternal Grandfather    Social History  Substance Use Topics  . Smoking status: Former Smoker -- 0.25 packs/day for 8 years    Types: Cigarettes  . Smokeless tobacco: Never Used  . Alcohol Use: Yes     Comment: social;    OB History    Gravida Para Term Preterm AB TAB SAB Ectopic Multiple Living   0 3     Review of Systems  Constitutional: Negative for fever.  Gastrointestinal: Negative for abdominal pain.  Genitourinary: Positive for vaginal discharge. Negative for dysuria, vaginal bleeding, vaginal pain and menstrual problem.  All other systems reviewed and are negative.     Allergies  Review of patient's  allergies indicates no known allergies.  Home Medications   Prior to Admission medications   Medication Sig Start Date End Date Taking? Authorizing Provider  diphenhydramine-acetaminophen (TYLENOL PM) 25-500 MG TABS Take 1 tablet by mouth at bedtime as needed (sleep).     Historical Provider, MD  ibuprofen (ADVIL,MOTRIN) 800 MG tablet Take 1 tablet (800 mg total) by mouth every 8 (eight) hours as needed for mild pain. Patient not taking: Reported on 09/26/2014 05/15/14   Kristen N Ward, DO  metroNIDAZOLE (FLAGYL) 500 MG tablet Take 1 tablet (500 mg total) by mouth 2 (two) times daily. 10/28/14   Burgess Amor, PA-C  oxyCODONE-acetaminophen (PERCOCET/ROXICET) 5-325 MG per tablet Take 1 tablet by mouth every 4 (four) hours as needed. Patient not taking: Reported on 09/26/2014 05/15/14   Kristen N Ward, DO  penicillin v potassium (VEETID) 500 MG tablet Take 1 tablet (500 mg total) by mouth 4 (four) times daily. Patient not taking: Reported on 09/26/2014 05/15/14   Kristen N Ward, DO  promethazine (PHENERGAN) 25 MG tablet Take 1 tablet (25 mg total) by mouth every 6 (six) hours as needed for nausea or vomiting. Patient not taking: Reported on 09/26/2014 05/15/14   Kristen N Ward, DO   BP 111/84 mmHg  Pulse 87  Temp(Src) 97.5 F (36.4 C) (Oral)  Resp 14  Ht 5' (1.524 m)  Wt 130 lb (58.968 kg)  BMI 25.39 kg/m2  SpO2 100%  LMP 11/08/2014 Physical Exam  Constitutional: She is oriented  to person, place, and time. She appears well-developed and well-nourished. No distress.  HENT:  Head: Normocephalic and atraumatic.  Right Ear: External ear normal.  Left Ear: External ear normal.  Nose: Nose normal.  Eyes: Right eye exhibits no discharge. Left eye exhibits no discharge.  Cardiovascular: Normal rate, regular rhythm and normal heart sounds.   Pulmonary/Chest: Effort normal and breath sounds normal.  Abdominal: Soft. She exhibits no distension. There is no tenderness.  Genitourinary: Uterus is not  enlarged and not tender. Cervix exhibits no motion tenderness and no friability. Right adnexum displays no mass and no tenderness. Left adnexum displays no mass and no tenderness. Vaginal discharge (white discharge) found.  Neurological: She is alert and oriented to person, place, and time.  Skin: Skin is warm and dry. She is not diaphoretic.  Nursing note and vitals reviewed.   ED Course  Procedures (including critical care time) Labs Review Labs Reviewed  WET PREP, GENITAL - Abnormal; Notable for the following:    Clue Cells Wet Prep HPF POC FEW (*)    WBC, Wet Prep HPF POC FEW (*)    All other components within normal limits  PREGNANCY, URINE  GC/CHLAMYDIA PROBE AMP () NOT AT [facility]    Imaging Review No results found. I have personally reviewed and evaluated these images and lab results as part of my medical decision-making.   EKG Interpretation None      MDM   Final diagnoses:  Vaginal discharge    Patient with recurrent vaginal discharge. Patient wants testing for gonorrhea/chlamydia but not treatment at this time until final results are in. A few clue cells but significantly less than 1 month ago. Discussed this might not be BV but given her recurrent symptoms with discharge will treat like BV. She is amenable to trying topical metronidazole instead of oral. I have strongly recommended she see a GYN for her recurrent issues.     Pricilla Loveless, MD 11/22/14 [phone]

## 2014-11-25 LAB — GC/CHLAMYDIA PROBE AMP (~~LOC~~) NOT AT ARMC
CHLAMYDIA, DNA PROBE: NEGATIVE
Neisseria Gonorrhea: NEGATIVE

## 2015-02-01 ENCOUNTER — Emergency Department (HOSPITAL_COMMUNITY)
Admission: EM | Admit: 2015-02-01 | Discharge: 2015-02-01 | Disposition: A | Discharge status: Home / Self Care | Payer: Self-pay | Attending: Emergency Medicine | Admitting: Emergency Medicine

## 2015-02-01 ENCOUNTER — Encounter (HOSPITAL_COMMUNITY): Payer: Self-pay | Admitting: Emergency Medicine

## 2015-02-01 DIAGNOSIS — Z792 Long term (current) use of antibiotics: Secondary | ICD-10-CM | POA: Insufficient documentation

## 2015-02-01 DIAGNOSIS — G8929 Other chronic pain: Secondary | ICD-10-CM | POA: Insufficient documentation

## 2015-02-01 DIAGNOSIS — H9202 Otalgia, left ear: Secondary | ICD-10-CM | POA: Insufficient documentation

## 2015-02-01 DIAGNOSIS — F17210 Nicotine dependence, cigarettes, uncomplicated: Secondary | ICD-10-CM | POA: Insufficient documentation

## 2015-02-01 DIAGNOSIS — K029 Dental caries, unspecified: Secondary | ICD-10-CM | POA: Insufficient documentation

## 2015-02-01 MED ORDER — NAPROXEN 500 MG PO TABS: 500.0000 mg | ORAL_TABLET | Freq: Two times a day (BID) | ORAL | Status: DC

## 2015-02-01 MED ORDER — AMOXICILLIN 500 MG PO CAPS: 500.0000 mg | ORAL_CAPSULE | Freq: Three times a day (TID) | ORAL | Status: DC

## 2015-02-01 NOTE — ED Provider Notes (Signed)
CSN: [id number]     Arrival date & time 02/01/15  0907 History   First MD Initiated Contact with Patient 02/01/15 [phone]     Chief Complaint  Patient presents with  . Otalgia     (Consider location/radiation/quality/duration/timing/severity/associated sxs/prior Treatment) The history is provided by the patient.   Jamie Hendricks is a 24 y.o. female presenting with a one week history of left ear ache. She reports removing excessive amounts of yellow wax which is unchanged this week.  She denies fevers, chills, decreased hearing acuity, nasal congestion, drainage or recent uri symptoms.  She does report chronic left sided upper and lower molar teeth pain, stating she cannot chew on the left side of her mouth.  She denies gingival swelling or drainage.  She has used tylenol and motrin this week without improvement.      Past Medical History  Diagnosis Date  . Medical history non-contributory   . Supervision of other normal pregnancy 07/30/2013   Past Surgical History  Procedure Laterality Date  . No past surgeries    . Laparoscopic bilateral salpingectomy Bilateral 03/20/2014    Procedure: LAPAROSCOPIC BILATERAL SALPINGECTOMY;  Surgeon: Lazaro ArmsLuther H Eure, MD;  Location: [facility];  Service: Gynecology;  Laterality: Bilateral;   Family History  Problem Relation Age of Onset  . Seizures Other   . Hypertension Other   . Diabetes Other   . Diabetes Father   . Hypertension Maternal Grandmother   . Diabetes Paternal Grandfather    Social History  Substance Use Topics  . Smoking status: Current Every Day Smoker -- 0.25 packs/day for 8 years    Types: Cigarettes  . Smokeless tobacco: Never Used  . Alcohol Use: Yes     Comment: social   OB History    Gravida Para Term Preterm AB TAB SAB Ectopic Multiple Living   3 3 3       0 3     Review of Systems  Constitutional: Negative for fever.  HENT: Positive for dental problem, ear discharge and ear pain. Negative for facial swelling and sore  throat.   Respiratory: Negative for shortness of breath.   Musculoskeletal: Negative for neck pain and neck stiffness.      Allergies  Review of patient's allergies indicates no known allergies.  Home Medications   Prior to Admission medications   Medication Sig Start Date End Date Taking? Authorizing Provider  amoxicillin (AMOXIL) 500 MG capsule Take 1 capsule (500 mg total) by mouth 3 (three) times daily. 02/01/15   Burgess AmorJulie Chalice Philbert, PA-C  diphenhydramine-acetaminophen (TYLENOL PM) 25-500 MG TABS Take 1 tablet by mouth at bedtime as needed (sleep).     Historical Provider, MD  ibuprofen (ADVIL,MOTRIN) 800 MG tablet Take 1 tablet (800 mg total) by mouth every 8 (eight) hours as needed for mild pain. Patient not taking: Reported on 09/26/2014 05/15/14   Kristen N Ward, DO  metroNIDAZOLE (FLAGYL) 500 MG tablet Take 1 tablet (500 mg total) by mouth 2 (two) times daily. 10/28/14   Burgess AmorJulie Torrence Hammack, PA-C  metroNIDAZOLE (METROGEL) 0.75 % gel Apply 1 application topically 2 (two) times daily. For 5 days 11/22/14   Pricilla LovelessScott Goldston, MD  naproxen (NAPROSYN) 500 MG tablet Take 1 tablet (500 mg total) by mouth 2 (two) times daily. 02/01/15   Burgess AmorJulie Brinna Divelbiss, PA-C  oxyCODONE-acetaminophen (PERCOCET/ROXICET) 5-325 MG per tablet Take 1 tablet by mouth every 4 (four) hours as needed. Patient not taking: Reported on 09/26/2014 05/15/14   Layla MawKristen N Ward, DO  penicillin  v potassium (VEETID) 500 MG tablet Take 1 tablet (500 mg total) by mouth 4 (four) times daily. Patient not taking: Reported on 09/26/2014 05/15/14   Kristen N Ward, DO  promethazine (PHENERGAN) 25 MG tablet Take 1 tablet (25 mg total) by mouth every 6 (six) hours as needed for nausea or vomiting. Patient not taking: Reported on 09/26/2014 05/15/14   Kristen N Ward, DO   BP 116/83 mmHg  Pulse 101  Temp(Src) 98.6 F (37 C) (Oral)  Resp 16  Ht 5' (1.524 m)  Wt 61.236 kg  BMI 26.37 kg/m2  SpO2 96%  LMP 01/13/2015 Physical Exam  Constitutional: She is oriented  to person, place, and time. She appears well-developed and well-nourished.  HENT:  Head: Normocephalic and atraumatic.  Right Ear: Tympanic membrane, external ear and ear canal normal. No tenderness. Tympanic membrane is not injected, not retracted and not bulging. No middle ear effusion.  Left Ear: Tympanic membrane, external ear and ear canal normal. Tympanic membrane is not injected, not retracted and not bulging.  No middle ear effusion.  Nose: No mucosal edema or rhinorrhea.  Mouth/Throat: Uvula is midline, oropharynx is clear and moist and mucous membranes are normal. No trismus in the jaw. Dental caries present. No uvula swelling. No oropharyngeal exudate, posterior oropharyngeal edema, posterior oropharyngeal erythema or tonsillar abscesses.    Scant yellow cerumen bilateral canals.  Eyes: Conjunctivae are normal.  Neck: Normal range of motion.  Lymphadenopathy:    She has no cervical adenopathy.  Neurological: She is alert and oriented to person, place, and time.  Skin: Skin is warm and dry.  Psychiatric: She has a normal mood and affect.    ED Course  Procedures (including critical care time) Labs Review Labs Reviewed - No data to display  Imaging Review No results found. I have personally reviewed and evaluated these images and lab results as part of my medical decision-making.   EKG Interpretation None      MDM   Final diagnoses:  Dental cavity  Otalgia of left ear    Suspect source of pain is left upper 1st molar,  Probable early abscess/infection.  She was placed on amoxil, naproxen prescribed, dental referrals given.    Burgess Amor, PA-C 02/01/15 1610  Glynn Octave, MD 02/01/15 [phone]

## 2015-02-01 NOTE — Discharge Instructions (Signed)
Dental Pain Dental pain may be caused by many things, including:  Tooth decay (cavities or caries). Cavities expose the nerve of your tooth to air and hot or cold temperatures. This can cause pain or discomfort.  Abscess or infection. A dental abscess is a collection of infected pus from a bacterial infection in the inner part of the tooth (pulp). It usually occurs at the end of the tooth's root.  Injury.  An unknown reason (idiopathic). Your pain may be mild or severe. It may only occur when:  You are chewing.  You are exposed to hot or cold temperature.  You are eating or drinking sugary foods or beverages, such as soda or candy. Your pain may also be constant. HOME CARE INSTRUCTIONS Watch your dental pain for any changes. The following actions may help to lessen any discomfort that you are feeling:  Take medicines only as directed by your dentist.  If you were prescribed an antibiotic medicine, finish all of it even if you start to feel better.  Keep all follow-up visits as directed by your dentist. This is important.  Do not apply heat to the outside of your face.  Rinse your mouth or gargle with salt water if directed by your dentist. This helps with pain and swelling.  You can make salt water by adding  tsp of salt to 1 cup of warm water.  Apply ice to the painful area of your face:  Put ice in a plastic bag.  Place a towel between your skin and the bag.  Leave the ice on for 20 minutes, 2-3 times per day.  Avoid foods or drinks that cause you pain, such as:  Very hot or very cold foods or drinks.  Sweet or sugary foods or drinks. SEEK MEDICAL CARE IF:  Your pain is not controlled with medicines.  Your symptoms are worse.  You have new symptoms. SEEK IMMEDIATE MEDICAL CARE IF:  You are unable to open your mouth.  You are having trouble breathing or swallowing.  You have a fever.  Your face, neck, or jaw is swollen.   This information is not  intended to replace advice given to you by your health care provider. Make sure you discuss any questions you have with your health care provider.   Document Released: 02/01/2005 Document Revised: 06/18/2014 Document Reviewed: 01/28/2014 Elsevier Interactive Patient Education 2016 [employer]  Earache An earache, also called otalgia, can be caused by many things. Pain from an earache can be sharp, dull, or burning. The pain may be temporary or constant. Earaches can be caused by problems with the ear, such as infection in either the middle ear or the ear canal, injury, impacted ear wax, middle ear pressure, or a foreign body in the ear. Ear pain can also result from problems in other areas. This is called referred pain. For example, pain can come from a sore throat, a tooth infection, or problems with the jaw or the joint between the jaw and the skull (temporomandibular joint, or TMJ). The cause of an earache is not always easy to identify. Watchful waiting may be appropriate for some earaches until a clear cause of the pain can be found. HOME CARE INSTRUCTIONS Watch your condition for any changes. The following actions may help to lessen any discomfort that you are feeling:  Take medicines only as directed by your health care provider. This includes ear drops.  Apply ice to your outer ear to help reduce pain.  Put ice  in a plastic bag.  Place a towel between your skin and the bag.  Leave the ice on for 20 minutes, 2-3 times per day.  Do not put anything in your ear other than medicine that is prescribed by your health care provider.  Try resting in an upright position instead of lying down. This may help to reduce pressure in the middle ear and relieve pain.  Chew gum if it helps to relieve your ear pain.  Control any allergies that you have.  Keep all follow-up visits as directed by your health care provider. This is important. SEEK MEDICAL CARE IF:  Your pain does not improve  within 2 days.  You have a fever.  You have new or worsening symptoms. SEEK IMMEDIATE MEDICAL CARE IF:  You have a severe headache.  You have a stiff neck.  You have difficulty swallowing.  You have redness or swelling behind your ear.  You have drainage from your ear.  You have hearing loss.  You feel dizzy.   This information is not intended to replace advice given to you by your health care provider. Make sure you discuss any questions you have with your health care provider.   Document Released: 09/19/2003 Document Revised: 02/22/2014 Document Reviewed: 09/02/2013 Elsevier Interactive Patient Education 2016 [employer].   Complete your entire course of antibiotics as prescribed.  You are being treated for probable early dental abscess.   Avoid applying heat or ice to this abscess area which can worsen your symptoms.  You may use warm salt water swish and spit treatment or half peroxide and water swish and spit after meals to keep this area clean.  Call the dentist listed above for further management of your symptoms.

## 2015-02-01 NOTE — ED Notes (Signed)
Patient c/o left ear ache x1 week. Per patient when she cleans ears excessive amount of yellow wax/drainage. Unsure of any fevers.
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
## 2016-12-09 ENCOUNTER — Emergency Department (HOSPITAL_COMMUNITY)
Admission: EM | Admit: 2016-12-09 | Discharge: 2016-12-09 | Disposition: A | Discharge status: Home / Self Care | Payer: Self-pay | Attending: Emergency Medicine | Admitting: Emergency Medicine

## 2016-12-09 ENCOUNTER — Encounter (HOSPITAL_COMMUNITY): Payer: Self-pay

## 2016-12-09 DIAGNOSIS — F17210 Nicotine dependence, cigarettes, uncomplicated: Secondary | ICD-10-CM | POA: Insufficient documentation

## 2016-12-09 DIAGNOSIS — Z79899 Other long term (current) drug therapy: Secondary | ICD-10-CM | POA: Insufficient documentation

## 2016-12-09 DIAGNOSIS — N12 Tubulo-interstitial nephritis, not specified as acute or chronic: Secondary | ICD-10-CM | POA: Insufficient documentation

## 2016-12-09 LAB — COMPREHENSIVE METABOLIC PANEL
ALK PHOS: 64 U/L (ref 38–126)
ALT: 24 U/L (ref 14–54)
AST: 24 U/L (ref 15–41)
Albumin: 4.3 g/dL (ref 3.5–5.0)
Anion gap: 8 (ref 5–15)
BILIRUBIN TOTAL: 0.3 mg/dL (ref 0.3–1.2)
BUN: 9 mg/dL (ref 6–20)
CALCIUM: 9.4 mg/dL (ref 8.9–10.3)
CO2: 24 mmol/L (ref 22–32)
Chloride: 104 mmol/L (ref 101–111)
Creatinine, Ser: 0.77 mg/dL (ref 0.44–1.00)
GFR calc Af Amer: >60 mL/min (ref >60)
GLUCOSE: 87 mg/dL (ref 65–99)
POTASSIUM: 4.2 mmol/L (ref 3.5–5.1)
Sodium: 136 mmol/L (ref 135–145)
TOTAL PROTEIN: 7.6 g/dL (ref 6.5–8.1)

## 2016-12-09 LAB — CBC WITH DIFFERENTIAL/PLATELET
Basophils Absolute: 0 10*3/uL (ref 0.0–0.1)
Basophils Relative: 0 %
EOS ABS: 0.3 10*3/uL (ref 0.0–0.7)
Eosinophils Relative: 5 %
HCT: 41 % (ref 36.0–46.0)
HEMOGLOBIN: 13.5 g/dL (ref 12.0–15.0)
Lymphocytes Relative: 37 %
Lymphs Abs: 2.6 10*3/uL (ref 0.7–4.0)
MCH: 28.7 pg (ref 26.0–34.0)
MCHC: 32.9 g/dL (ref 30.0–36.0)
MCV: 87 fL (ref 78.0–100.0)
Monocytes Absolute: 0.4 10*3/uL (ref 0.1–1.0)
Monocytes Relative: 6 %
NEUTROS ABS: 3.7 10*3/uL (ref 1.7–7.7)
Neutrophils Relative %: 52 %
Platelets: 422 10*3/uL — ABNORMAL HIGH (ref 150–400)
RBC: 4.71 MIL/uL (ref 3.87–5.11)
RDW: 14.3 % (ref 11.5–15.5)
WBC: 7.1 10*3/uL (ref 4.0–10.5)

## 2016-12-09 LAB — URINALYSIS, ROUTINE W REFLEX MICROSCOPIC
BILIRUBIN URINE: NEGATIVE
GLUCOSE, UA: NEGATIVE mg/dL
KETONES UR: NEGATIVE mg/dL
Nitrite: NEGATIVE
PH: 5 (ref 5.0–8.0)
Protein, ur: NEGATIVE mg/dL
SPECIFIC GRAVITY, URINE: 1.021 (ref 1.005–1.030)

## 2016-12-09 LAB — POC URINE PREG, ED: PREG TEST UR: NEGATIVE

## 2016-12-09 LAB — LIPASE, BLOOD: LIPASE: 30 U/L (ref 11–51)

## 2016-12-09 MED ORDER — IBUPROFEN 800 MG PO TABS
800.0000 mg | ORAL_TABLET | Freq: Once | ORAL | Status: AC
  Administered 2016-12-09: 800 mg via ORAL
  Filled 2016-12-09: qty 1

## 2016-12-09 MED ORDER — IBUPROFEN 800 MG PO TABS: 800.0000 mg | ORAL_TABLET | Freq: Three times a day (TID) | ORAL | 0 refills | Status: DC

## 2016-12-09 MED ORDER — ONDANSETRON 4 MG PO TBDP: 4.0000 mg | ORAL_TABLET | Freq: Three times a day (TID) | ORAL | 0 refills | Status: DC | PRN

## 2016-12-09 MED ORDER — CEFTRIAXONE SODIUM 1 G IJ SOLR
1.0000 g | Freq: Once | INTRAMUSCULAR | Status: AC
  Administered 2016-12-09: 1 g via INTRAMUSCULAR
  Filled 2016-12-09: qty 10

## 2016-12-09 MED ORDER — LIDOCAINE HCL (PF) 1 % IJ SOLN
INTRAMUSCULAR | Status: AC
  Administered 2016-12-09: 2.1 mL
  Filled 2016-12-09: qty 5

## 2016-12-09 MED ORDER — CEPHALEXIN 500 MG PO CAPS: 500.0000 mg | ORAL_CAPSULE | Freq: Two times a day (BID) | ORAL | 0 refills | Status: AC

## 2016-12-09 NOTE — ED Triage Notes (Signed)
Patient complains of lower abdominal /pelvic pain for 3-5 days. Reports that her last menstrual cycle was irregular much lighter than usual. Denies vomiting, denies dysuria

## 2016-12-09 NOTE — ED Provider Notes (Signed)
[facility] EMERGENCY DEPARTMENT Provider Note   CSN: [id number] Arrival date & time: 12/09/16  1340     History   Chief Complaint Chief Complaint  Patient presents with  . Abdominal Pain    HPI Hassan Bucklerunashay M Kreamer is a 26 y.o. female.  HPI  The patient is a 26 year old female, she has no prior abdominal surgical history other than a bilateral tubal ligation several years ago.  She presents to the hospital with approximately 4 days of gradual onset suprapubic and lower abdominal discomfort which has been progressive and gradually worsening, not associated with fevers or chills though she did have some nausea today.  No diarrhea, no swelling of the legs, no vaginal discharge.  She does endorse some urinary frequency and dysuria but denies any blood in the place of mild lower back pain especially on the left.  Past Medical History:  Diagnosis Date  . Medical history non-contributory   . Supervision of other normal pregnancy 07/30/2013    Patient Active Problem List   Diagnosis Date Noted  . Pregnancy 01/31/2014  . Contraception tubal ligation at 4-6 wk pp 01/28/2014  . IUGR (intrauterine growth restriction) 11/27/2013  . Echogenic intracardiac focus of fetus on prenatal ultrasound 10/30/2013  . Preterm contractions 10/30/2013  . BV (bacterial vaginosis) 10/26/2013  . Two vessel umbilical cord, antepartum 10/01/2013  . 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 H/O postpartum hemorrhage, currently pregnant 08/27/2013  . Smoker 08/27/2013  . Supervision of high risk pregnancy in third trimester 07/30/2013    Past Surgical History:  Procedure Laterality Date  . LAPAROSCOPIC BILATERAL SALPINGECTOMY Bilateral 03/20/2014   Procedure: LAPAROSCOPIC BILATERAL SALPINGECTOMY;  Surgeon: Lazaro ArmsLuther H Eure, MD;  Location: [facility];  Service: Gynecology;  Laterality: Bilateral;  . NO PAST SURGERIES      OB History    Gravida Para Term Preterm AB Living   3 3 3     3    SAB TAB Ectopic  Multiple Live Births         0 3       Home Medications    Prior to Admission medications   Medication Sig Start Date End Date Taking? Authorizing Provider  amoxicillin (AMOXIL) 500 MG capsule Take 1 capsule (500 mg total) by mouth 3 (three) times daily. 02/01/15   Burgess AmorIdol, Julie, PA-C  cephALEXin (KEFLEX) 500 MG capsule Take 1 capsule (500 mg total) by mouth 2 (two) times daily. 12/09/16 12/16/16  Eber HongMiller, Terrez Ander, MD  diphenhydramine-acetaminophen (TYLENOL PM) 25-500 MG TABS Take 1 tablet by mouth at bedtime as needed (sleep).     [provider]  ibuprofen (ADVIL,MOTRIN) 800 MG tablet Take 1 tablet (800 mg total) by mouth 3 (three) times daily. 12/09/16   Eber HongMiller, Teja Costen, MD  metroNIDAZOLE (FLAGYL) 500 MG tablet Take 1 tablet (500 mg total) by mouth 2 (two) times daily. 10/28/14   Burgess AmorIdol, Julie, PA-C  metroNIDAZOLE (METROGEL) 0.75 % gel Apply 1 application topically 2 (two) times daily. For 5 days 11/22/14   Pricilla LovelessGoldston, Scott, MD  naproxen (NAPROSYN) 500 MG tablet Take 1 tablet (500 mg total) by mouth 2 (two) times daily. 02/01/15   Burgess AmorIdol, Julie, PA-C  ondansetron (ZOFRAN ODT) 4 MG disintegrating tablet Take 1 tablet (4 mg total) by mouth every 8 (eight) hours as needed for nausea. 12/09/16   Eber HongMiller, Merriam Brandner, MD  oxyCODONE-acetaminophen (PERCOCET/ROXICET) 5-325 MG per tablet Take 1 tablet by mouth every 4 (four) hours as needed. Patient not taking: Reported on 09/26/2014 05/15/14  Ward, Layla Maw, DO  penicillin v potassium (VEETID) 500 MG tablet Take 1 tablet (500 mg total) by mouth 4 (four) times daily. Patient not taking: Reported on 09/26/2014 05/15/14   Ward, Layla Maw, DO  promethazine (PHENERGAN) 25 MG tablet Take 1 tablet (25 mg total) by mouth every 6 (six) hours as needed for nausea or vomiting. Patient not taking: Reported on 09/26/2014 05/15/14   Ward, Layla Maw, DO    Family History Family History  Problem Relation Age of Onset  . Diabetes Father   . Hypertension Maternal Grandmother    . Diabetes Paternal Grandfather   . Seizures Other   . Hypertension Other   . Diabetes Other     Social History Social History  Substance Use Topics  . Smoking status: Current Every Day Smoker    Packs/day: 0.25    Years: 8.00    Types: Cigarettes  . Smokeless tobacco: Never Used  . Alcohol use Yes     Comment: social     Allergies   Patient has no known allergies.   Review of Systems Review of Systems  All other systems reviewed and are negative.    Physical Exam Updated Vital Signs BP 117/83   Pulse 95   Temp 98.1 F (36.7 C) (Oral)   Resp 16   Ht 5' (1.524 m)   Wt 65.8 kg (145 lb)   SpO2 100%   BMI 28.32 kg/m   Physical Exam  Constitutional: She appears well-developed and well-nourished. No distress.  HENT:  Head: Normocephalic and atraumatic.  Mouth/Throat: Oropharynx is clear and moist. No oropharyngeal exudate.  Eyes: Pupils are equal, round, and reactive to light. Conjunctivae and EOM are normal. Right eye exhibits no discharge. Left eye exhibits no discharge. No scleral icterus.  Neck: Normal range of motion. Neck supple. No JVD present. No thyromegaly present.  Cardiovascular: Normal rate, regular rhythm, normal heart sounds and intact distal pulses.  Exam reveals no gallop and no friction rub.   No murmur heard. Pulmonary/Chest: Effort normal and breath sounds normal. No respiratory distress. She has no wheezes. She has no rales.  Abdominal: Soft. Bowel sounds are normal. She exhibits no distension and no mass. There is tenderness ( Mild suprapubic tenderness).  Left-sided flank CVA tenderness  Musculoskeletal: Normal range of motion. She exhibits no edema or tenderness.  Lymphadenopathy:    She has no cervical adenopathy.  Neurological: She is alert. Coordination normal.  Skin: Skin is warm and dry. No rash noted. No erythema.  Psychiatric: She has a normal mood and affect. Her behavior is normal.  Nursing note and vitals reviewed.    ED  Treatments / Results  Labs (all labs ordered are listed, but only abnormal results are displayed) Labs Reviewed  URINALYSIS, ROUTINE W REFLEX MICROSCOPIC - Abnormal; Notable for the following:       Result Value   APPearance HAZY (*)    Hgb urine dipstick MODERATE (*)    Leukocytes, UA LARGE (*)    Bacteria, UA MANY (*)    Squamous Epithelial / LPF 0-5 (*)    All other components within normal limits  CBC WITH DIFFERENTIAL/PLATELET - Abnormal; Notable for the following:    Platelets 422 (*)    All other components within normal limits  URINE CULTURE  COMPREHENSIVE METABOLIC PANEL  LIPASE, BLOOD  POC URINE PREG, ED     Radiology No results found.  Procedures Procedures (including critical care time)  Medications Ordered in ED Medications  cefTRIAXone (ROCEPHIN) injection 1 g (not administered)  ibuprofen (ADVIL,MOTRIN) tablet 800 mg (not administered)  lidocaine (PF) (XYLOCAINE) 1 % injection (not administered)     Initial Impression / Assessment and Plan / ED Course  I have reviewed the triage vital signs and the nursing notes.  Pertinent labs & imaging results that were available during my care of the patient were reviewed by me and considered in my medical decision making (see chart for details).      patient does have some tenderness consistent with either cystitis or Pyelonephritis, will treat with antibiotics.  There is no leukocytosis, no fevers and renal funciton is normal  IM rocephin Ibuprofen Stable for d/c Not pregnant The patient was informed of the results, she will get medications and will follow-up in the outpatient setting.  Urinary culture ordered secondary to the patient's possible pyelonephritis.  Final Clinical Impressions(s) / ED Diagnoses   Final diagnoses:  Pyelonephritis    New Prescriptions New Prescriptions   CEPHALEXIN (KEFLEX) 500 MG CAPSULE    Take 1 capsule (500 mg total) by mouth 2 (two) times daily.   IBUPROFEN (ADVIL,MOTRIN)  800 MG TABLET    Take 1 tablet (800 mg total) by mouth 3 (three) times daily.   ONDANSETRON (ZOFRAN ODT) 4 MG DISINTEGRATING TABLET    Take 1 tablet (4 mg total) by mouth every 8 (eight) hours as needed for nausea.     Eber Hong, MD 12/09/16 [phone]

## 2016-12-09 NOTE — ED Notes (Signed)
Two gold tops sent down to the lab

## 2016-12-09 NOTE — Discharge Instructions (Signed)
Keflex twice daily for 7 days Motrin for pain Zofran for nauseas  Drink plenty of fluids  ER for worsening symptoms.

## 2020-12-25 ENCOUNTER — Emergency Department (HOSPITAL_COMMUNITY)
Admission: EM | Admit: 2020-12-25 | Discharge: 2020-12-25 | Disposition: A | Discharge status: Home / Self Care | Payer: BC Managed Care – PPO | Attending: Emergency Medicine | Admitting: Emergency Medicine

## 2020-12-25 ENCOUNTER — Other Ambulatory Visit: Payer: Self-pay

## 2020-12-25 ENCOUNTER — Encounter (HOSPITAL_COMMUNITY): Payer: Self-pay

## 2020-12-25 DIAGNOSIS — M791 Myalgia, unspecified site: Secondary | ICD-10-CM | POA: Insufficient documentation

## 2020-12-25 DIAGNOSIS — J069 Acute upper respiratory infection, unspecified: Secondary | ICD-10-CM | POA: Diagnosis not present

## 2020-12-25 DIAGNOSIS — R059 Cough, unspecified: Secondary | ICD-10-CM | POA: Diagnosis present

## 2020-12-25 DIAGNOSIS — F17210 Nicotine dependence, cigarettes, uncomplicated: Secondary | ICD-10-CM | POA: Diagnosis not present

## 2020-12-25 DIAGNOSIS — J111 Influenza due to unidentified influenza virus with other respiratory manifestations: Secondary | ICD-10-CM

## 2020-12-25 MED ORDER — BENZONATATE 100 MG PO CAPS
100.0000 mg | ORAL_CAPSULE | Freq: Once | ORAL | Status: AC
  Administered 2020-12-25: 100 mg via ORAL
  Filled 2020-12-25: qty 1

## 2020-12-25 MED ORDER — ALBUTEROL SULFATE HFA 108 (90 BASE) MCG/ACT IN AERS
2.0000 | INHALATION_SPRAY | Freq: Once | RESPIRATORY_TRACT | Status: AC
  Administered 2020-12-25: 2 via RESPIRATORY_TRACT
  Filled 2020-12-25: qty 6.7

## 2020-12-25 MED ORDER — BENZONATATE 100 MG PO CAPS: 100.0000 mg | ORAL_CAPSULE | Freq: Three times a day (TID) | ORAL | 0 refills | Status: DC

## 2020-12-25 NOTE — ED Triage Notes (Signed)
Pt reports cough that started Monday, pt says she is coughing up a little white sputum. No fever, no congestion.

## 2020-12-25 NOTE — ED Provider Notes (Signed)
[facility] EMERGENCY DEPARTMENT Provider Note   CSN: [id number] Arrival date & time: 12/25/20  0200     History Chief Complaint  Patient presents with   Cough    Jamie Hendricks is a 30 y.o. female.  The history is provided by the patient.  Cough Cough characteristics:  Productive Sputum characteristics:  Clear Severity:  Moderate Onset quality:  Gradual Duration:  2 days Timing:  Intermittent Progression:  Worsening Chronicity:  New Smoker: yes   Relieved by:  Nothing Worsened by:  Nothing Associated symptoms: chills, myalgias, shortness of breath, sinus congestion and sore throat       Past Medical History:  Diagnosis Date   Medical history non-contributory    Supervision of other normal pregnancy 07/30/2013    Patient Active Problem List   Diagnosis Date Noted   Pregnancy 01/31/2014   Contraception tubal ligation at 4-6 wk pp 01/28/2014   IUGR (intrauterine growth restriction) 11/27/2013   Echogenic intracardiac focus of fetus on prenatal ultrasound 10/30/2013   Preterm contractions 10/30/2013   BV (bacterial vaginosis) 10/26/2013   Two vessel umbilical cord, antepartum 10/01/2013   Marijuana use 08/27/2013   H/O postpartum hemorrhage, currently pregnant 08/27/2013   Smoker 08/27/2013   Supervision of high risk pregnancy in third trimester 07/30/2013    Past Surgical History:  Procedure Laterality Date   LAPAROSCOPIC BILATERAL SALPINGECTOMY Bilateral 03/20/2014   Procedure: LAPAROSCOPIC BILATERAL SALPINGECTOMY;  Surgeon: Lazaro Arms, MD;  Location: [facility];  Service: Gynecology;  Laterality: Bilateral;   NO PAST SURGERIES       OB History     Gravida  3   Para  3   Term  3   Preterm      AB      Living  3      SAB      IAB      Ectopic      Multiple  0   Live Births  3           Family History  Problem Relation Age of Onset   Diabetes Father    Hypertension Maternal Grandmother    Diabetes Paternal Grandfather     Seizures Other    Hypertension Other    Diabetes Other     Social History   Tobacco Use   Smoking status: Every Day    Packs/day: 0.25    Years: 8.00    Pack years: 2.00    Types: Cigarettes   Smokeless tobacco: Never  Substance Use Topics   Alcohol use: Yes    Comment: social   Drug use: No    Home Medications Prior to Admission medications   Medication Sig Start Date End Date Taking? Authorizing Provider  benzonatate (TESSALON) 100 MG capsule Take 1 capsule (100 mg total) by mouth every 8 (eight) hours. 12/25/20  Yes Zadie Rhine, MD  ferrous sulfate 325 (65 FE) MG tablet Take 1 tablet (325 mg total) by mouth 2 (two) times daily with a meal. Patient not taking: Reported on 03/14/2014 02/02/14 05/15/14  Cheral Marker, CNM    Allergies    Patient has no known allergies.  Review of Systems   Review of Systems  Constitutional:  Positive for chills and fatigue.  HENT:  Positive for sore throat.   Respiratory:  Positive for cough and shortness of breath.   Gastrointestinal:  Negative for vomiting.  Musculoskeletal:  Positive for myalgias.  All other systems reviewed and are negative.  Physical  Exam Updated Vital Signs BP 102/68   Pulse 82   Temp 98 F (36.7 C) (Oral)   Resp 16   Ht 1.524 m (5')   Wt 78.8 kg   SpO2 100%   BMI 33.93 kg/m   Physical Exam CONSTITUTIONAL: Well developed/well nourished HEAD: Normocephalic/atraumatic EYES: EOMI/PERRL ENMT: Mucous membranes moist, uvula midline no erythema or exudate NECK: supple no meningeal signs SPINE/BACK:entire spine nontender CV: S1/S2 noted, no murmurs/rubs/gallops noted LUNGS: Lungs are clear to auscultation bilaterally, no apparent distress ABDOMEN: soft, nontender NEURO: Pt is awake/alert/appropriate, moves all extremitiesx4.     EXTREMITIES: pulses normal/equal, full ROM SKIN: warm, color normal PSYCH: no abnormalities of mood noted, alert and oriented to situation  ED Results / Procedures /  Treatments   Labs (all labs ordered are listed, but only abnormal results are displayed) Labs Reviewed - No data to display  EKG None  Radiology No results found.  Procedures Procedures   Medications Ordered in ED Medications  benzonatate (TESSALON) capsule 100 mg (100 mg Oral Given 12/25/20 0359)  albuterol (VENTOLIN HFA) 108 (90 Base) MCG/ACT inhaler 2 puff (2 puffs Inhalation Given 12/25/20 0400)    ED Course  I have reviewed the triage vital signs and the nursing notes.     MDM Rules/Calculators/A&P                           Given symptoms, patient likely has influenza.  She is in no distress.  Lung sounds clear.  No hypoxia.  After discussion, we will defer any testing.  We will start her on Tessalon Perles and albuterol.  We discussed strict  return precautions Final Clinical Impression(s) / ED Diagnoses Final diagnoses:  Viral URI with cough  Influenza-like illness    Rx / DC Orders ED Discharge Orders          Ordered    benzonatate (TESSALON) 100 MG capsule  Every 8 hours        12/25/20 0347             Zadie Rhine, MD 12/25/20 [phone]

## 2022-02-11 ENCOUNTER — Ambulatory Visit
Admission: EM | Admit: 2022-02-11 | Discharge: 2022-02-11 | Disposition: A | Discharge status: Home / Self Care | Payer: BC Managed Care – PPO | Attending: Nurse Practitioner | Admitting: Nurse Practitioner

## 2022-02-11 DIAGNOSIS — Z1152 Encounter for screening for COVID-19: Secondary | ICD-10-CM | POA: Diagnosis not present

## 2022-02-11 DIAGNOSIS — B349 Viral infection, unspecified: Secondary | ICD-10-CM | POA: Diagnosis not present

## 2022-02-11 MED ORDER — FLUTICASONE PROPIONATE 50 MCG/ACT NA SUSP: 2.0000 | Freq: Every day | NASAL | 0 refills | Status: AC

## 2022-02-11 MED ORDER — PROMETHAZINE-DM 6.25-15 MG/5ML PO SYRP: 5.0000 mL | ORAL_SOLUTION | Freq: Four times a day (QID) | ORAL | 0 refills | Status: DC | PRN

## 2022-02-11 NOTE — ED Provider Notes (Signed)
[facility]    CSN: [id number] Arrival date & time: 02/11/22  1450      History   Chief Complaint Chief Complaint  Patient presents with   Cough    Cough, chest pain with cough and body aches. X3 days    HPI Jamie Hendricks is a 31 y.o. female.   The history is provided by the patient.   The patient presents with a 3-day history of fatigue, body aches, cough, and chest pain with coughing.  Patient also reports episodes of chills.  She states that she has been in the bed over the past 3 days, and today is the first day she is getting out.  She denies fever, chills, headache, sore throat, wheezing, shortness of breath, difficulty breathing, abdominal pain, nausea, vomiting, or diarrhea.  She reports she has been taking over-the-counter cough and cold medications for her symptoms.  Past Medical History:  Diagnosis Date   Medical history non-contributory    Supervision of other normal pregnancy 07/30/2013    Patient Active Problem List   Diagnosis Date Noted   Pregnancy 01/31/2014   Contraception tubal ligation at 4-6 wk pp 01/28/2014   IUGR (intrauterine growth restriction) 11/27/2013   Echogenic intracardiac focus of fetus on prenatal ultrasound 10/30/2013   Preterm contractions 10/30/2013   BV (bacterial vaginosis) 10/26/2013   Two vessel umbilical cord, antepartum 10/01/2013   Marijuana use 08/27/2013   H/O postpartum hemorrhage, currently pregnant 08/27/2013   Smoker 08/27/2013   Supervision of high risk pregnancy in third trimester 07/30/2013    Past Surgical History:  Procedure Laterality Date   LAPAROSCOPIC BILATERAL SALPINGECTOMY Bilateral 03/20/2014   Procedure: LAPAROSCOPIC BILATERAL SALPINGECTOMY;  Surgeon: Lazaro Arms, MD;  Location: [facility];  Service: Gynecology;  Laterality: Bilateral;   NO PAST SURGERIES      OB History     Gravida  3   Para  3   Term  3   Preterm      AB      Living  3      SAB      IAB      Ectopic       Multiple  0   Live Births  3            Home Medications    Prior to Admission medications   Medication Sig Start Date End Date Taking? Authorizing Provider  fluticasone (FLONASE) 50 MCG/ACT nasal spray Place 2 sprays into both nostrils daily. 02/11/22  Yes Jinan Biggins-Warren, Sadie Haber, NP  promethazine-dextromethorphan (PROMETHAZINE-DM) 6.25-15 MG/5ML syrup Take 5 mLs by mouth 4 (four) times daily as needed for cough. 02/11/22  Yes Kenniya Westrich-Warren, Sadie Haber, NP  benzonatate (TESSALON) 100 MG capsule Take 1 capsule (100 mg total) by mouth every 8 (eight) hours. 12/25/20   Zadie Rhine, MD  ferrous sulfate 325 (65 FE) MG tablet Take 1 tablet (325 mg total) by mouth 2 (two) times daily with a meal. Patient not taking: Reported on 03/14/2014 02/02/14 05/15/14  Cheral Marker, CNM    Family History Family History  Problem Relation Age of Onset   Diabetes Father    Hypertension Maternal Grandmother    Diabetes Paternal Grandfather    Seizures Other    Hypertension Other    Diabetes Other     Social History Social History   Tobacco Use   Smoking status: Every Day    Packs/day: 0.50    Years: 8.00    Total pack years:  4.00    Types: Cigarettes   Smokeless tobacco: Never  Substance Use Topics   Alcohol use: Yes    Comment: social   Drug use: No     Allergies   Patient has no known allergies.   Review of Systems Review of Systems Per HPI  Physical Exam Triage Vital Signs ED Triage Vitals  Enc Vitals Group     BP 02/11/22 1553 107/73     Pulse Rate 02/11/22 1553 97     Resp 02/11/22 1553 18     Temp 02/11/22 1553 98.8 F (37.1 C)     Temp Source 02/11/22 1553 Oral     SpO2 02/11/22 1553 98 %     Weight 02/11/22 1551 178 lb (80.7 kg)     Height 02/11/22 1551 5' (1.524 m)     Head Circumference --      Peak Flow --      Pain Score 02/11/22 1551 6     Pain Loc --      Pain Edu? --      Excl. in GC? --    No data found.  Updated Vital Signs BP  107/73 (BP Location: Right Arm)   Pulse 97   Temp 98.8 F (37.1 C) (Oral)   Resp 18   Ht 5' (1.524 m)   Wt 178 lb (80.7 kg)   LMP 02/10/2022   SpO2 98%   BMI 34.76 kg/m   Visual Acuity Right Eye Distance:   Left Eye Distance:   Bilateral Distance:    Right Eye Near:   Left Eye Near:    Bilateral Near:     Physical Exam Vitals and nursing note reviewed.  Constitutional:      General: She is not in acute distress.    Appearance: Normal appearance.  HENT:     Head: Normocephalic.     Right Ear: Tympanic membrane, ear canal and external ear normal.     Left Ear: Tympanic membrane, ear canal and external ear normal.     Nose: Congestion present. No rhinorrhea.     Right Turbinates: Enlarged and swollen.     Left Turbinates: Enlarged and swollen.     Right Sinus: No maxillary sinus tenderness or frontal sinus tenderness.     Left Sinus: No maxillary sinus tenderness or frontal sinus tenderness.     Mouth/Throat:     Lips: Pink.     Mouth: Mucous membranes are moist.     Pharynx: Uvula midline. Posterior oropharyngeal erythema present. No pharyngeal swelling.  Eyes:     Extraocular Movements: Extraocular movements intact.     Conjunctiva/sclera: Conjunctivae normal.     Pupils: Pupils are equal, round, and reactive to light.  Cardiovascular:     Rate and Rhythm: Normal rate and regular rhythm.     Pulses: Normal pulses.     Heart sounds: Normal heart sounds.  Pulmonary:     Effort: Pulmonary effort is normal. No respiratory distress.     Breath sounds: Normal breath sounds. No stridor. No wheezing, rhonchi or rales.  Abdominal:     General: Bowel sounds are normal.     Palpations: Abdomen is soft.     Tenderness: There is no abdominal tenderness.  Musculoskeletal:     Cervical back: Normal range of motion.  Lymphadenopathy:     Cervical: No cervical adenopathy.  Skin:    General: Skin is warm and dry.  Neurological:     General: No focal deficit present.  Mental Status: She is alert and oriented to person, place, and time.  Psychiatric:        Mood and Affect: Mood normal.        Behavior: Behavior normal.      UC Treatments / Results  Labs (all labs ordered are listed, but only abnormal results are displayed) Labs Reviewed  SARS CORONAVIRUS 2 (TAT 6-24 HRS)    EKG   Radiology No results found.  Procedures Procedures (including critical care time)  Medications Ordered in UC Medications - No data to display  Initial Impression / Assessment and Plan / UC Course  I have reviewed the triage vital signs and the nursing notes.  Pertinent labs & imaging results that were available during my care of the patient were reviewed by me and considered in my medical decision making (see chart for details).  The patient is well-appearing, she is in no acute distress, vital signs are stable.  Symptoms consistent with a viral illness.  Most likely influenza.  Patient is out of the window to receive Tamiflu.  Will treat patient's cough with Promethazine DM, and for her nasal congestion, fluticasone 50 mcg nasal spray was prescribed.  Supportive care recommendations were provided to the patient to include increasing fluids, allowing for plenty of rest, and use of a humidifier in her bedroom at nighttime during sleep.  Patient was given strict indications of when to follow-up.  Patient verbalizes understanding.  All questions were answered.  Patient stable for discharge.  Note was provided for work.   Final Clinical Impressions(s) / UC Diagnoses   Final diagnoses:  Encounter for screening for COVID-19  Viral illness     Discharge Instructions      COVID test is pending. You will be contacted if the test result is positive. You are a candidate to receive Paxlovid if your test is positive.  Take medication as prescribed. May take over-the-counter Tylenol or Ibuprofen as needed for pain, fever or general discomfort. Increase fluids and allow  for plenty of rest.  Try to drink at least 8-10 8 ounce glasses of water while symptoms persist. Warm salt water gargles 3-4 times daily for throat pain or discomfort. Recommend sleeping elevated on pillows and using a humidifier in your bedroom at nighttime during sleep while cough symptoms persist. Recommend a brat diet until nausea improves.  This includes bananas, rice, applesauce, and toast. Recommend remaining isolated until you have been fever free for at least 24 hours with no medication. Please be advised that a viral illness can last anywhere from 7 to 14 days.  If symptoms suddenly worsen, or extend beyond that time, please follow-up with your primary care physician for further evaluation. Follow-up as needed.      ED Prescriptions     Medication Sig Dispense Auth. Provider   promethazine-dextromethorphan (PROMETHAZINE-DM) 6.25-15 MG/5ML syrup Take 5 mLs by mouth 4 (four) times daily as needed for cough. 118 mL Tavien Chestnut-Warren, Sadie Haber, NP   fluticasone (FLONASE) 50 MCG/ACT nasal spray Place 2 sprays into both nostrils daily. 16 g Lean Jaeger-Warren, Sadie Haber, NP      PDMP not reviewed this encounter.   Abran Cantor, NP 02/11/22 [id number]

## 2022-02-11 NOTE — Discharge Instructions (Addendum)
COVID test is pending. You will be contacted if the test result is positive. You are a candidate to receive Paxlovid if your test is positive.  Take medication as prescribed. May take over-the-counter Tylenol or Ibuprofen as needed for pain, fever or general discomfort. Increase fluids and allow for plenty of rest.  Try to drink at least 8-10 8 ounce glasses of water while symptoms persist. Warm salt water gargles 3-4 times daily for throat pain or discomfort. Recommend sleeping elevated on pillows and using a humidifier in your bedroom at nighttime during sleep while cough symptoms persist. Recommend a brat diet until nausea improves.  This includes bananas, rice, applesauce, and toast. Recommend remaining isolated until you have been fever free for at least 24 hours with no medication. Please be advised that a viral illness can last anywhere from 7 to 14 days.  If symptoms suddenly worsen, or extend beyond that time, please follow-up with your primary care physician for further evaluation. Follow-up as needed.

## 2022-02-11 NOTE — ED Triage Notes (Signed)
Pt states that she has a cough, chest  pain with cough and body aches. X3 days

## 2022-02-12 LAB — SARS CORONAVIRUS 2 (TAT 6-24 HRS): SARS Coronavirus 2: POSITIVE — AB

## 2022-08-04 ENCOUNTER — Ambulatory Visit (HOSPITAL_COMMUNITY)
Admission: EM | Admit: 2022-08-04 | Discharge: 2022-08-04 | Disposition: A | Discharge status: Home / Self Care | Payer: BC Managed Care – PPO | Attending: Family Medicine | Admitting: Family Medicine

## 2022-08-04 ENCOUNTER — Encounter (HOSPITAL_COMMUNITY): Payer: Self-pay | Admitting: Emergency Medicine

## 2022-08-04 ENCOUNTER — Other Ambulatory Visit: Payer: Self-pay

## 2022-08-04 DIAGNOSIS — J069 Acute upper respiratory infection, unspecified: Secondary | ICD-10-CM | POA: Diagnosis present

## 2022-08-04 DIAGNOSIS — Z1152 Encounter for screening for COVID-19: Secondary | ICD-10-CM | POA: Insufficient documentation

## 2022-08-04 MED ORDER — BENZONATATE 100 MG PO CAPS: 100.0000 mg | ORAL_CAPSULE | Freq: Three times a day (TID) | ORAL | 0 refills | Status: DC | PRN

## 2022-08-04 MED ORDER — ONDANSETRON 4 MG PO TBDP: 4.0000 mg | ORAL_TABLET | Freq: Three times a day (TID) | ORAL | 0 refills | Status: AC | PRN

## 2022-08-04 NOTE — ED Provider Notes (Signed)
[facility]    CSN: [id number] Arrival date & time: 08/04/22  1422      History   Chief Complaint Chief Complaint  Patient presents with   Nausea    Pt is c/o generalized body aches, dizziness and nausea for the past 2 days.   Dizziness    HPI Jamie Hendricks is a 32 y.o. female.    Dizziness  Here for nausea and lightheadedness.  She is also developed a cough and some postnasal drainage.  She is maybe a little short of breath.  Symptoms began on June 17.  She has not had any vomiting but she has had some episodes of diarrhea.  She has had a little stomach cramping also.  No known exposures  She is not allergic to medications  Last menstrual cycle was late May  Past Medical History:  Diagnosis Date   Medical history non-contributory    Supervision of other normal pregnancy 07/30/2013    Patient Active Problem List   Diagnosis Date Noted   Pregnancy 01/31/2014   Contraception tubal ligation at 4-6 wk pp 01/28/2014   IUGR (intrauterine growth restriction) 11/27/2013   Echogenic intracardiac focus of fetus on prenatal ultrasound 10/30/2013   Preterm contractions 10/30/2013   BV (bacterial vaginosis) 10/26/2013   Two vessel umbilical cord, antepartum 10/01/2013   Marijuana use 08/27/2013   H/O postpartum hemorrhage, currently pregnant 08/27/2013   Smoker 08/27/2013   Supervision of high risk pregnancy in third trimester 07/30/2013    Past Surgical History:  Procedure Laterality Date   LAPAROSCOPIC BILATERAL SALPINGECTOMY Bilateral 03/20/2014   Procedure: LAPAROSCOPIC BILATERAL SALPINGECTOMY;  Surgeon: Lazaro Arms, MD;  Location: [facility];  Service: Gynecology;  Laterality: Bilateral;   NO PAST SURGERIES      OB History     Gravida  3   Para  3   Term  3   Preterm      AB      Living  3      SAB      IAB      Ectopic      Multiple  0   Live Births  3            Home Medications    Prior to Admission medications    Medication Sig Start Date End Date Taking? Authorizing Provider  benzonatate (TESSALON) 100 MG capsule Take 1 capsule (100 mg total) by mouth 3 (three) times daily as needed for cough. 08/04/22  Yes Zenia Resides, MD  ondansetron (ZOFRAN-ODT) 4 MG disintegrating tablet Take 1 tablet (4 mg total) by mouth every 8 (eight) hours as needed for nausea or vomiting. 08/04/22  Yes Zenia Resides, MD  fluticasone (FLONASE) 50 MCG/ACT nasal spray Place 2 sprays into both nostrils daily. 02/11/22   Leath-Warren, Sadie Haber, NP  ferrous sulfate 325 (65 FE) MG tablet Take 1 tablet (325 mg total) by mouth 2 (two) times daily with a meal. Patient not taking: Reported on 03/14/2014 02/02/14 05/15/14  Cheral Marker, CNM    Family History Family History  Problem Relation Age of Onset   Diabetes Father    Hypertension Maternal Grandmother    Diabetes Paternal Grandfather    Seizures Other    Hypertension Other    Diabetes Other     Social History Social History   Tobacco Use   Smoking status: Every Day    Packs/day: 0.50    Years: 8.00    Additional pack  years: 0.00    Total pack years: 4.00    Types: Cigarettes   Smokeless tobacco: Never  Substance Use Topics   Alcohol use: Yes    Comment: social   Drug use: No     Allergies   Patient has no known allergies.   Review of Systems Review of Systems  Neurological:  Positive for dizziness.     Physical Exam Triage Vital Signs ED Triage Vitals  Enc Vitals Group     BP 08/04/22 1437 120/73     Pulse Rate 08/04/22 1437 85     Resp 08/04/22 1437 16     Temp 08/04/22 1437 98.5 F (36.9 C)     Temp Source 08/04/22 1437 Oral     SpO2 08/04/22 1437 98 %     Weight 08/04/22 1438 178 lb 9.2 oz (81 kg)     Height 08/04/22 1438 5' (1.524 m)     Head Circumference --      Peak Flow --      Pain Score 08/04/22 1438 6     Pain Loc --      Pain Edu? --      Excl. in GC? --    No data found.  Updated Vital Signs BP 120/73  (BP Location: Right Arm)   Pulse 85   Temp 98.5 F (36.9 C) (Oral)   Resp 16   Ht 5' (1.524 m)   Wt 81 kg   SpO2 98%   BMI 34.88 kg/m   Visual Acuity Right Eye Distance:   Left Eye Distance:   Bilateral Distance:    Right Eye Near:   Left Eye Near:    Bilateral Near:     Physical Exam Vitals reviewed.  Constitutional:      General: She is not in acute distress.    Appearance: She is not ill-appearing, toxic-appearing or diaphoretic.  HENT:     Right Ear: Tympanic membrane and ear canal normal.     Left Ear: Tympanic membrane and ear canal normal.     Nose: Congestion present.     Mouth/Throat:     Mouth: Mucous membranes are moist.     Pharynx: No oropharyngeal exudate or posterior oropharyngeal erythema.  Eyes:     Extraocular Movements: Extraocular movements intact.     Conjunctiva/sclera: Conjunctivae normal.     Pupils: Pupils are equal, round, and reactive to light.  Cardiovascular:     Rate and Rhythm: Normal rate and regular rhythm.     Heart sounds: No murmur heard. Pulmonary:     Effort: Pulmonary effort is normal. No respiratory distress.     Breath sounds: No stridor. No wheezing, rhonchi or rales.  Abdominal:     General: Bowel sounds are normal. There is no distension.     Palpations: Abdomen is soft.     Tenderness: There is no abdominal tenderness. There is no guarding.  Musculoskeletal:     Cervical back: Neck supple.  Lymphadenopathy:     Cervical: No cervical adenopathy.  Skin:    Capillary Refill: Capillary refill takes less than 2 seconds.     Coloration: Skin is not jaundiced or pale.  Neurological:     General: No focal deficit present.     Mental Status: She is alert and oriented to person, place, and time.  Psychiatric:        Behavior: Behavior normal.      UC Treatments / Results  Labs (all labs ordered are listed,  but only abnormal results are displayed) Labs Reviewed  SARS CORONAVIRUS 2 (TAT 6-24 HRS)     EKG   Radiology No results found.  Procedures Procedures (including critical care time)  Medications Ordered in UC Medications - No data to display  Initial Impression / Assessment and Plan / UC Course  I have reviewed the triage vital signs and the nursing notes.  Pertinent labs & imaging results that were available during my care of the patient were reviewed by me and considered in my medical decision making (see chart for details).        Zofran is sent in for the nausea and Tessalon Perles are sent in in case she needs it for cough. COVID swab is done so she will know if she needs to isolate.  We will notify her if it is positive. Final Clinical Impressions(s) / UC Diagnoses   Final diagnoses:  Viral URI     Discharge Instructions      Ondansetron dissolved in the mouth every 8 hours as needed for nausea or vomiting. Clear liquids(water, gatorade/pedialyte, ginger ale/sprite, chicken broth/soup) and bland things(crackers/toast, rice, potato, bananas) to eat. Avoid acidic foods like lemon/lime/orange/tomato, and avoid greasy/spicy foods.   Take benzonatate 100 mg, 1 tab every 8 hours as needed for cough.   You have been swabbed for COVID, and the test will result in the next 24 hours. Our staff will call you if positive. If the COVID test is positive, you should quarantine until you are fever free for 24 hours and you are starting to feel better, and then take added precautions for the next 5 days, such as physical distancing/wearing a mask and good hand hygiene/washing.       ED Prescriptions     Medication Sig Dispense Auth. Provider   ondansetron (ZOFRAN-ODT) 4 MG disintegrating tablet Take 1 tablet (4 mg total) by mouth every 8 (eight) hours as needed for nausea or vomiting. 10 tablet Zenia Resides, MD   benzonatate (TESSALON) 100 MG capsule Take 1 capsule (100 mg total) by mouth 3 (three) times daily as needed for cough. 21 capsule Zenia Resides, MD      PDMP not reviewed this encounter.   Zenia Resides, MD 08/04/22 [id number]

## 2022-08-04 NOTE — Discharge Instructions (Signed)
Ondansetron dissolved in the mouth every 8 hours as needed for nausea or vomiting. Clear liquids(water, gatorade/pedialyte, ginger ale/sprite, chicken broth/soup) and bland things(crackers/toast, rice, potato, bananas) to eat. Avoid acidic foods like lemon/lime/orange/tomato, and avoid greasy/spicy foods.  Take benzonatate 100 mg, 1 tab every 8 hours as needed for cough.   You have been swabbed for COVID, and the test will result in the next 24 hours. Our staff will call you if positive. If the COVID test is positive, you should quarantine until you are fever free for 24 hours and you are starting to feel better, and then take added precautions for the next 5 days, such as physical distancing/wearing a mask and good hand hygiene/washing.   

## 2022-08-04 NOTE — ED Triage Notes (Signed)
Pt is c/o generalized body aches, dizziness and nausea for the past 2 days.

## 2022-08-05 LAB — SARS CORONAVIRUS 2 (TAT 6-24 HRS): SARS Coronavirus 2: NEGATIVE

## 2023-04-23 ENCOUNTER — Ambulatory Visit

## 2023-04-25 ENCOUNTER — Encounter (HOSPITAL_BASED_OUTPATIENT_CLINIC_OR_DEPARTMENT_OTHER): Payer: Self-pay

## 2023-04-25 ENCOUNTER — Emergency Department (HOSPITAL_BASED_OUTPATIENT_CLINIC_OR_DEPARTMENT_OTHER)
Admission: EM | Admit: 2023-04-25 | Discharge: 2023-04-25 | Disposition: A | Discharge status: Home / Self Care | Attending: Emergency Medicine | Admitting: Emergency Medicine

## 2023-04-25 ENCOUNTER — Emergency Department (HOSPITAL_BASED_OUTPATIENT_CLINIC_OR_DEPARTMENT_OTHER)

## 2023-04-25 ENCOUNTER — Other Ambulatory Visit: Payer: Self-pay

## 2023-04-25 ENCOUNTER — Ambulatory Visit (HOSPITAL_COMMUNITY): Admission: EM | Admit: 2023-04-25 | Discharge: 2023-04-25 | Disposition: A | Discharge status: Home / Self Care

## 2023-04-25 ENCOUNTER — Encounter (HOSPITAL_COMMUNITY): Payer: Self-pay | Admitting: Emergency Medicine

## 2023-04-25 DIAGNOSIS — R112 Nausea with vomiting, unspecified: Secondary | ICD-10-CM | POA: Diagnosis not present

## 2023-04-25 DIAGNOSIS — R197 Diarrhea, unspecified: Secondary | ICD-10-CM | POA: Diagnosis not present

## 2023-04-25 DIAGNOSIS — R1033 Periumbilical pain: Secondary | ICD-10-CM | POA: Diagnosis not present

## 2023-04-25 DIAGNOSIS — F17210 Nicotine dependence, cigarettes, uncomplicated: Secondary | ICD-10-CM | POA: Insufficient documentation

## 2023-04-25 DIAGNOSIS — K429 Umbilical hernia without obstruction or gangrene: Secondary | ICD-10-CM

## 2023-04-25 LAB — CBC
HCT: 40.9 % (ref 36.0–46.0)
Hemoglobin: 13.4 g/dL (ref 12.0–15.0)
MCH: 27.7 pg (ref 26.0–34.0)
MCHC: 32.8 g/dL (ref 30.0–36.0)
MCV: 84.7 fL (ref 80.0–100.0)
Platelets: 420 10*3/uL — ABNORMAL HIGH (ref 150–400)
RBC: 4.83 MIL/uL (ref 3.87–5.11)
RDW: 15.9 % — ABNORMAL HIGH (ref 11.5–15.5)
WBC: 8.4 10*3/uL (ref 4.0–10.5)
nRBC: 0 % (ref 0.0–0.2)

## 2023-04-25 LAB — COMPREHENSIVE METABOLIC PANEL
ALT: 40 U/L (ref 0–44)
AST: 30 U/L (ref 15–41)
Albumin: 4.9 g/dL (ref 3.5–5.0)
Alkaline Phosphatase: 61 U/L (ref 38–126)
Anion gap: 9 (ref 5–15)
BUN: 13 mg/dL (ref 6–20)
CO2: 23 mmol/L (ref 22–32)
Calcium: 9.8 mg/dL (ref 8.9–10.3)
Chloride: 105 mmol/L (ref 98–111)
Creatinine, Ser: 0.84 mg/dL (ref 0.44–1.00)
GFR, Estimated: >60 mL/min (ref >60)
Glucose, Bld: 92 mg/dL (ref 70–99)
Potassium: 4.3 mmol/L (ref 3.5–5.1)
Sodium: 137 mmol/L (ref 135–145)
Total Bilirubin: 0.3 mg/dL (ref 0.0–1.2)
Total Protein: 7.9 g/dL (ref 6.5–8.1)

## 2023-04-25 LAB — URINALYSIS, ROUTINE W REFLEX MICROSCOPIC
Bilirubin Urine: NEGATIVE
Glucose, UA: NEGATIVE mg/dL
Ketones, ur: NEGATIVE mg/dL
Nitrite: NEGATIVE
Protein, ur: NEGATIVE mg/dL
Specific Gravity, Urine: 1.02 (ref 1.005–1.030)
pH: 7 (ref 5.0–8.0)

## 2023-04-25 LAB — URINALYSIS, MICROSCOPIC (REFLEX)

## 2023-04-25 LAB — LIPASE, BLOOD: Lipase: 15 U/L (ref 11–51)

## 2023-04-25 LAB — PREGNANCY, URINE: Preg Test, Ur: NEGATIVE

## 2023-04-25 MED ORDER — IOHEXOL 300 MG/ML  SOLN
100.0000 mL | Freq: Once | INTRAMUSCULAR | Status: AC | PRN
  Administered 2023-04-25: 100 mL via INTRAVENOUS

## 2023-04-25 MED ORDER — CEFTRIAXONE SODIUM 500 MG IJ SOLR
500.0000 mg | Freq: Once | INTRAMUSCULAR | Status: AC
  Administered 2023-04-25: 500 mg via INTRAMUSCULAR
  Filled 2023-04-25: qty 500

## 2023-04-25 MED ORDER — ONDANSETRON HCL 4 MG/2ML IJ SOLN
4.0000 mg | Freq: Once | INTRAMUSCULAR | Status: AC
  Administered 2023-04-25: 4 mg via INTRAVENOUS
  Filled 2023-04-25: qty 2

## 2023-04-25 MED ORDER — AZITHROMYCIN 250 MG PO TABS
1000.0000 mg | ORAL_TABLET | Freq: Once | ORAL | Status: AC
  Administered 2023-04-25: 1000 mg via ORAL
  Filled 2023-04-25: qty 4

## 2023-04-25 MED ORDER — METRONIDAZOLE 500 MG PO TABS
2000.0000 mg | ORAL_TABLET | Freq: Once | ORAL | Status: AC
  Administered 2023-04-25: 2000 mg via ORAL
  Filled 2023-04-25: qty 4

## 2023-04-25 MED ORDER — LIDOCAINE HCL (PF) 1 % IJ SOLN
1.0000 mL | Freq: Once | INTRAMUSCULAR | Status: AC
  Administered 2023-04-25: 1.5 mL
  Filled 2023-04-25: qty 5

## 2023-04-25 MED ORDER — MORPHINE SULFATE (PF) 4 MG/ML IV SOLN
4.0000 mg | Freq: Once | INTRAVENOUS | Status: AC
  Administered 2023-04-25: 4 mg via INTRAVENOUS
  Filled 2023-04-25: qty 1

## 2023-04-25 MED ORDER — SODIUM CHLORIDE 0.9 % IV BOLUS
1000.0000 mL | Freq: Once | INTRAVENOUS | Status: AC
  Administered 2023-04-25: 1000 mL via INTRAVENOUS

## 2023-04-25 NOTE — ED Provider Notes (Signed)
 [place] [facility] AT [facility] Provider Note  CSN: [id number] Arrival date & time: 04/25/23 1102  Chief Complaint(s) Abdominal Pain  HPI AYZIA DAY is a 33 y.o. female with past medical history as below, significant for BV, marijuana use, tobacco use, who presents to the ED with complaint of abdominal pain, leakage of fluid from umbilicus  She reports periumbilical abdominal pain over the past couple days, noticed some clear liquid coming out of her bellybutton.  No malodorous liquid, no purulent drainage.  No fevers or chills, no vomiting but does have some nausea.  No BRBPR or melena, no diarrhea.  No recent travel or sick contacts, no abdominal trauma.  She was seen urgent care and sent to the ED for evaluation.  Past Medical History Past Medical History:  Diagnosis Date   Medical history non-contributory    Supervision of other normal pregnancy 07/30/2013   Patient Active Problem List   Diagnosis Date Noted   Pregnancy 01/31/2014   Contraception tubal ligation at 4-6 wk pp 01/28/2014   IUGR (intrauterine growth restriction) 11/27/2013   Echogenic intracardiac focus of fetus on prenatal ultrasound 10/30/2013   Preterm contractions 10/30/2013   BV (bacterial vaginosis) 10/26/2013   Two vessel umbilical cord, antepartum 10/01/2013   Marijuana use 08/27/2013   H/O postpartum hemorrhage, currently pregnant 08/27/2013   Smoker 08/27/2013   Supervision of high risk pregnancy in third trimester 07/30/2013   Home Medication(s) Prior to Admission medications   Medication Sig Start Date End Date Taking? Authorizing Provider  benzonatate (TESSALON) 100 MG capsule Take 1 capsule (100 mg total) by mouth 3 (three) times daily as needed for cough. 08/04/22   Zenia Resides, MD  fluticasone (FLONASE) 50 MCG/ACT nasal spray Place 2 sprays into both nostrils daily. 02/11/22   Leath-Warren, Sadie Haber, NP  ondansetron (ZOFRAN-ODT) 4 MG disintegrating tablet Take  1 tablet (4 mg total) by mouth every 8 (eight) hours as needed for nausea or vomiting. 08/04/22   Zenia Resides, MD  ferrous sulfate 325 (65 FE) MG tablet Take 1 tablet (325 mg total) by mouth 2 (two) times daily with a meal. Patient not taking: Reported on 03/14/2014 02/02/14 05/15/14  Cheral Marker, CNM                                                                                                                                    Past Surgical History Past Surgical History:  Procedure Laterality Date   LAPAROSCOPIC BILATERAL SALPINGECTOMY Bilateral 03/20/2014   Procedure: LAPAROSCOPIC BILATERAL SALPINGECTOMY;  Surgeon: Lazaro Arms, MD;  Location: [facility];  Service: Gynecology;  Laterality: Bilateral;   NO PAST SURGERIES     Family History Family History  Problem Relation Age of Onset   Diabetes Father    Hypertension Maternal Grandmother    Diabetes Paternal Grandfather    Seizures Other    Hypertension Other  Diabetes Other     Social History Social History   Tobacco Use   Smoking status: Every Day    Current packs/day: 0.50    Average packs/day: 0.5 packs/day for 8.0 years (4.0 ttl pk-yrs)    Types: Cigarettes   Smokeless tobacco: Never  Substance Use Topics   Alcohol use: Yes    Comment: social   Drug use: No   Allergies Patient has no known allergies.  Review of Systems A thorough review of systems was obtained and all systems are negative except as noted in the HPI and PMH.   Physical Exam Vital Signs  I have reviewed the triage vital signs BP (!) 158/134   Pulse 85   Temp 98.2 F (36.8 C) (Oral)   Resp 18   LMP 03/31/2023   SpO2 99%  Physical Exam Vitals and nursing note reviewed.  Constitutional:      General: She is not in acute distress.    Appearance: Normal appearance. She is well-developed. She is obese.  HENT:     Head: Normocephalic and atraumatic.     Right Ear: External ear normal.     Left Ear: External ear normal.     Nose:  Nose normal.     Mouth/Throat:     Mouth: Mucous membranes are moist.  Eyes:     General: No scleral icterus.       Right eye: No discharge.        Left eye: No discharge.  Cardiovascular:     Rate and Rhythm: Normal rate and regular rhythm.     Pulses: Normal pulses.     Heart sounds: Normal heart sounds.  Pulmonary:     Effort: Pulmonary effort is normal. No respiratory distress.     Breath sounds: Normal breath sounds. No stridor.  Abdominal:     General: Abdomen is flat. There is no distension.     Palpations: Abdomen is soft.     Tenderness: There is abdominal tenderness in the periumbilical area.       Comments: No obvious wound or fluid visible leaking from umbilicus.  Musculoskeletal:     Cervical back: No rigidity.     Right lower leg: No edema.     Left lower leg: No edema.  Skin:    General: Skin is warm and dry.     Capillary Refill: Capillary refill takes less than 2 seconds.  Neurological:     Mental Status: She is alert.  Psychiatric:        Mood and Affect: Mood normal.        Behavior: Behavior normal. Behavior is cooperative.     ED Results and Treatments Labs (all labs ordered are listed, but only abnormal results are displayed) Labs Reviewed  CBC - Abnormal; Notable for the following components:      Result Value   RDW 15.9 (*)    Platelets 420 (*)    All other components within normal limits  URINALYSIS, ROUTINE W REFLEX MICROSCOPIC - Abnormal; Notable for the following components:   Color, Urine STRAW (*)    Hgb urine dipstick SMALL (*)    Leukocytes,Ua SMALL (*)    All other components within normal limits  URINALYSIS, MICROSCOPIC (REFLEX) - Abnormal; Notable for the following components:   Bacteria, UA FEW (*)    Trichomonas, UA PRESENT (*)    All other components within normal limits  LIPASE, BLOOD  COMPREHENSIVE METABOLIC PANEL  PREGNANCY, URINE  Radiology No results found.  Pertinent labs & imaging results that were available during my care of the patient were reviewed by me and considered in my medical decision making (see MDM for details).  Medications Ordered in ED Medications  iohexol (OMNIPAQUE) 300 MG/ML solution 100 mL (100 mLs Intravenous Contrast Given 04/25/23 1236)  morphine (PF) 4 MG/ML injection 4 mg (4 mg Intravenous Given 04/25/23 1540)  ondansetron (ZOFRAN) injection 4 mg (4 mg Intravenous Given 04/25/23 1540)  sodium chloride 0.9 % bolus 1,000 mL (1,000 mLs Intravenous New Bag/Given 04/25/23 1537)                                                                                                                                     Procedures Procedures  (including critical care time)  Medical Decision Making / ED Course    Medical Decision Making:    SANDRIA MCENROE is a 33 y.o. female with past medical history as below, significant for BV, marijuana use, tobacco use, who presents to the ED with complaint of abdominal pain, leakage of fluid from umbilicus. The complaint involves an extensive differential diagnosis and also carries with it a high risk of complications and morbidity.  Serious etiology was considered. Ddx includes but is not limited to: Differential diagnosis includes but is not exclusive to ectopic pregnancy, ovarian cyst, ovarian torsion, acute appendicitis, urinary tract infection, endometriosis, bowel obstruction, hernia, colitis, renal colic, gastroenteritis, volvulus etc.   Complete initial physical exam performed, notably the patient was in no distress, sitting comfortably on stretcher.    Reviewed and confirmed nursing documentation for past medical history, family history, social history.  Vital signs reviewed.       Brief summary: 33 year old female here with periumbilical pain, fluid leaking from umbilicus. Labs reviewed, she has trichomonas in her urine.   Labs otherwise reassuring. Was given analgesics which did improve her discomfort. CT abdomen pending at time of shift change, handoff to Dr. Rhunette Croft pending CT and recheck               Additional history obtained: -Additional history obtained from friend -External records from outside source obtained and reviewed including: Chart review including previous notes, labs, imaging, consultation notes including  Urgent care documentation, prior ED visits, home medications, prior imaging   Lab Tests: -I ordered, reviewed, and interpreted labs.   The pertinent results include:   Labs Reviewed  CBC - Abnormal; Notable for the following components:      Result Value   RDW 15.9 (*)    Platelets 420 (*)    All other components within normal limits  URINALYSIS, ROUTINE W REFLEX MICROSCOPIC - Abnormal; Notable for the following components:   Color, Urine STRAW (*)    Hgb urine dipstick SMALL (*)    Leukocytes,Ua SMALL (*)    All other components within normal limits  URINALYSIS, MICROSCOPIC (REFLEX) - Abnormal; Notable for the following  components:   Bacteria, UA FEW (*)    Trichomonas, UA PRESENT (*)    All other components within normal limits  LIPASE, BLOOD  COMPREHENSIVE METABOLIC PANEL  PREGNANCY, URINE    Notable for trichomonas  EKG   EKG Interpretation Date/Time:    Ventricular Rate:    PR Interval:    QRS Duration:    QT Interval:    QTC Calculation:   R Axis:      Text Interpretation:           Imaging Studies ordered: I ordered imaging studies including CTAP > pending   Medicines ordered and prescription drug management: Meds ordered this encounter  Medications   iohexol (OMNIPAQUE) 300 MG/ML solution 100 mL   morphine (PF) 4 MG/ML injection 4 mg   ondansetron (ZOFRAN) injection 4 mg   sodium chloride 0.9 % bolus 1,000 mL    -I have reviewed the patients home medicines and have made adjustments as needed   Consultations  Obtained: na   Cardiac Monitoring: Continuous pulse oximetry interpreted by myself, 100% on RA.    Social Determinants of Health:  Diagnosis or treatment significantly limited by social determinants of health: obesity   Reevaluation: After the interventions noted above, I reevaluated the patient and found that they have improved  Co morbidities that complicate the patient evaluation  Past Medical History:  Diagnosis Date   Medical history non-contributory    Supervision of other normal pregnancy 07/30/2013      Dispostion: Disposition decision including need for hospitalization was considered, and patient disposition pending at time of sign out.    Final Clinical Impression(s) / ED Diagnoses Final diagnoses:  None        Sloan Leiter, DO 04/25/23 1550

## 2023-04-25 NOTE — ED Provider Notes (Signed)
  Physical Exam  BP 117/78 (BP Location: Right Arm)   Pulse 74   Temp 98.2 F (36.8 C) (Oral)   Resp 16   LMP 03/31/2023   SpO2 100%   Physical Exam  Procedures  Procedures  ED Course / MDM    Medical Decision Making Amount and/or Complexity of Data Reviewed Labs: ordered. Radiology: ordered.  Risk Prescription drug management.   CT results and Trichomonas finding in urine discussed. Advised surgery follow up if needed.  The patient appears reasonably screened and/or stabilized for discharge and I doubt any other medical condition or other [facility] requiring further screening, evaluation, or treatment in the ED at this time prior to discharge.   Results from the ER workup discussed with the patient face to face and all questions answered to the best of my ability. The patient is safe for discharge with strict return precautions.        Derwood Kaplan, MD 04/25/23 [id number]

## 2023-04-25 NOTE — Discharge Instructions (Signed)
 Discharged to ER patient will need further evaluation labs completed and CT scan.

## 2023-04-25 NOTE — ED Notes (Signed)
 Patient is being discharged from the Urgent Care and sent to the Emergency Department via POV with significant other. Per Maple Mirza, NP, patient is in need of higher level of care due to abdominal pain with drainage from umbilical area. Patient is aware and verbalizes understanding of plan of care.  Vitals:   04/25/23 1003  BP: (!) 120/56  Pulse: 82  Resp: 20  Temp: 97.7 F (36.5 C)  SpO2: 100%

## 2023-04-25 NOTE — ED Triage Notes (Signed)
 Pt c/o lower abd pain/ periumbilical onset Thursday night. Advises discharge from belly button. Burning, sharp periumbilical pain. Endorses nausea, diarrhea.   Seen at [facility], sent here for further eval/CT

## 2023-04-25 NOTE — Discharge Instructions (Addendum)
 Ct scan shows that there is a small hernia in your abdomen. Consider seeing General Surgery by calling the number provided for further recommendations.  We also gave you antibiotics to cover for trichomonas infection. Have your partner get antibiotics too.

## 2023-04-25 NOTE — ED Triage Notes (Signed)
 Pt c/o lower abd pain that started on Thursday. "Something going in here" (pulls up shirt to show belly button). Pain is burning. Reports blood and white clearish coming out of belly button. Pain worse with movement.  Reports n/v, can't "hold it" when asked about urination, reports stools are loose and having decreased appetite.  Tried antiseptic, cold press and Tylenol and ibuprofen.

## 2023-04-25 NOTE — ED Provider Notes (Addendum)
 [facility]    CSN: [id number] Arrival date & time: 04/25/23  2952      History   Chief Complaint Chief Complaint  Patient presents with   Abdominal Pain    HPI Jamie Hendricks is a 33 y.o. female.   Patient presents today complaining of severe umbilical pain a tearing of skin sensation with drainage to her umbilical area since Thursday.  Patient states that the pain is getting worse it feels like something is tearing apart in that area.  Has had intermittent thick drainage and blood discharge.  Patient has been using over-the-counter anesthetic spray and Tylenol with no relief.  Patient denies any urinary symptoms.  Abdomen slightly distended patient is holding for abdomen for pain relief.  Unknown if any fevers.  Does have some nausea and diarrhea.  Last bowel movement was last night unknown if bloody stools.    Past Medical History:  Diagnosis Date   Medical history non-contributory    Supervision of other normal pregnancy 07/30/2013    Patient Active Problem List   Diagnosis Date Noted   Pregnancy 01/31/2014   Contraception tubal ligation at 4-6 wk pp 01/28/2014   IUGR (intrauterine growth restriction) 11/27/2013   Echogenic intracardiac focus of fetus on prenatal ultrasound 10/30/2013   Preterm contractions 10/30/2013   BV (bacterial vaginosis) 10/26/2013   Two vessel umbilical cord, antepartum 10/01/2013   Marijuana use 08/27/2013   H/O postpartum hemorrhage, currently pregnant 08/27/2013   Smoker 08/27/2013   Supervision of high risk pregnancy in third trimester 07/30/2013    Past Surgical History:  Procedure Laterality Date   LAPAROSCOPIC BILATERAL SALPINGECTOMY Bilateral 03/20/2014   Procedure: LAPAROSCOPIC BILATERAL SALPINGECTOMY;  Surgeon: Lazaro Arms, MD;  Location: [facility];  Service: Gynecology;  Laterality: Bilateral;   NO PAST SURGERIES      OB History     Gravida  3   Para  3   Term  3   Preterm      AB      Living  3       SAB      IAB      Ectopic      Multiple  0   Live Births  3            Home Medications    Prior to Admission medications   Medication Sig Start Date End Date Taking? Authorizing Provider  benzonatate (TESSALON) 100 MG capsule Take 1 capsule (100 mg total) by mouth 3 (three) times daily as needed for cough. 08/04/22   Zenia Resides, MD  fluticasone (FLONASE) 50 MCG/ACT nasal spray Place 2 sprays into both nostrils daily. 02/11/22   Leath-Warren, Sadie Haber, NP  ondansetron (ZOFRAN-ODT) 4 MG disintegrating tablet Take 1 tablet (4 mg total) by mouth every 8 (eight) hours as needed for nausea or vomiting. 08/04/22   Zenia Resides, MD  ferrous sulfate 325 (65 FE) MG tablet Take 1 tablet (325 mg total) by mouth 2 (two) times daily with a meal. Patient not taking: Reported on 03/14/2014 02/02/14 05/15/14  Cheral Marker, CNM    Family History Family History  Problem Relation Age of Onset   Diabetes Father    Hypertension Maternal Grandmother    Diabetes Paternal Grandfather    Seizures Other    Hypertension Other    Diabetes Other     Social History Social History   Tobacco Use   Smoking status: Every Day    Current packs/day:  0.50    Average packs/day: 0.5 packs/day for 8.0 years (4.0 ttl pk-yrs)    Types: Cigarettes   Smokeless tobacco: Never  Substance Use Topics   Alcohol use: Yes    Comment: social   Drug use: No     Allergies   Patient has no known allergies.   Review of Systems Review of Systems  Constitutional:  Positive for chills.       Unknown of any documented fevers  Respiratory: Negative.    Cardiovascular: Negative.   Gastrointestinal:  Positive for abdominal distention, abdominal pain, diarrhea, nausea and vomiting.       Loose stools x 2 unknown of blood or dark stools  Genitourinary:        Urinary symptoms at this time  Skin:  Positive for wound.       Patient very tender to palpation observation of drainage to umbilical area   Neurological: Negative.      Physical Exam Triage Vital Signs ED Triage Vitals  Encounter Vitals Group     BP 04/25/23 1003 (!) 120/56     Systolic BP Percentile --      Diastolic BP Percentile --      Pulse Rate 04/25/23 1003 82     Resp 04/25/23 1003 20     Temp 04/25/23 1003 97.7 F (36.5 C)     Temp Source 04/25/23 1003 Oral     SpO2 04/25/23 1003 100 %     Weight --      Height --      Head Circumference --      Peak Flow --      Pain Score 04/25/23 1001 6     Pain Loc --      Pain Education --      Exclude from Growth Chart --    No data found.  Updated Vital Signs BP (!) 120/56 (BP Location: Right Arm)   Pulse 82   Temp 97.7 F (36.5 C) (Oral)   Resp 20   LMP 03/31/2023   SpO2 100%   Visual Acuity Right Eye Distance:   Left Eye Distance:   Bilateral Distance:    Right Eye Near:   Left Eye Near:    Bilateral Near:     Physical Exam Constitutional:      Comments: Patient very restless hold in lower abdominal area complaining of pain.  On auscultation of the abdomen patient very resistant during assessment  Cardiovascular:     Rate and Rhythm: Normal rate.  Pulmonary:     Effort: Pulmonary effort is normal.  Abdominal:     General: Bowel sounds are normal. There is distension.     Palpations: Abdomen is soft.     Tenderness: There is abdominal tenderness in the periumbilical area. There is guarding. There is no right CVA tenderness or left CVA tenderness.     Comments: Umbilical area posterior has white crusted drainage also noted some recent yellow in color drainage.  Pain to palpation.  Patient unknown of any history of hernia  Neurological:     Mental Status: She is alert.      UC Treatments / Results  Labs (all labs ordered are listed, but only abnormal results are displayed) Labs Reviewed - No data to display   EKG   Radiology No results found.  Procedures Procedures (including critical care time)  Medications Ordered in  UC Medications - No data to display  Initial Impression / Assessment and Plan / UC  Course  I have reviewed the triage vital signs and the nursing notes.  Pertinent labs & imaging results that were available during my care of the patient were reviewed by me and considered in my medical decision making (see chart for details).     Discussed with patient and family member further exam needs to be completed with lab work and CT scan.  Need to rule out a fistula or possible hernia.  Limited assessment due to to pain.  Patient's family member will take patient to ER for further evaluation.  Patient will have family will drive her to the ER know transportation is needed.  Patient and family member understands plan of care.  Final Clinical Impressions(s) / UC Diagnoses   Final diagnoses:  Periumbilical abdominal pain  Nausea vomiting and diarrhea     Discharge Instructions      Discharged to ER patient will need further evaluation labs completed and CT scan.     ED Prescriptions   None    PDMP not reviewed this encounter.   Coralyn Mark, NP 04/25/23 1547    Coralyn Mark, NP 04/25/23 [phone]

## 2024-03-06 ENCOUNTER — Ambulatory Visit
Admission: RE | Admit: 2024-03-06 | Discharge: 2024-03-06 | Disposition: A | Discharge status: Home / Self Care | Source: Ambulatory Visit | Attending: Family Medicine | Admitting: Family Medicine

## 2024-03-06 VITALS — BP 141/101 | HR 96 | Temp 98.0°F | Resp 18

## 2024-03-06 DIAGNOSIS — J029 Acute pharyngitis, unspecified: Secondary | ICD-10-CM | POA: Diagnosis not present

## 2024-03-06 DIAGNOSIS — R051 Acute cough: Secondary | ICD-10-CM

## 2024-03-06 DIAGNOSIS — B349 Viral infection, unspecified: Secondary | ICD-10-CM

## 2024-03-06 LAB — POC COVID19/FLU A&B COMBO
Covid Antigen, POC: NEGATIVE
Influenza A Antigen, POC: NEGATIVE
Influenza B Antigen, POC: NEGATIVE

## 2024-03-06 LAB — POCT RAPID STREP A (OFFICE): Rapid Strep A Screen: NEGATIVE

## 2024-03-06 MED ORDER — BENZONATATE 200 MG PO CAPS: 200.0000 mg | ORAL_CAPSULE | Freq: Three times a day (TID) | ORAL | 0 refills | Status: AC | PRN

## 2024-03-06 NOTE — ED Triage Notes (Signed)
 Pt present with c/o body aches, chest heaviness and congestion x 2 days. Pt states she has not had an appetite and c/o headaches.

## 2024-03-06 NOTE — ED Provider Notes (Signed)
 " [facility]    CSN: [id number] Arrival date & time: 03/06/24  1507      History   Chief Complaint Chief Complaint  Patient presents with   Letter for School/Work    Body pain, chest congested and head ache - Entered by patient    HPI Jamie Hendricks is a 34 y.o. female  presents for evaluation of URI symptoms for 2 days. Patient reports associated symptoms of cough, congestion, sore throat, headache, diarrhea. Denies fevers, nausea/vomiting, ear pain, shortness of breath. Patient does not have a hx of asthma. Patient is an active smoker.   Reports sick contacts at work.  Pt has taken nothing OTC for symptoms. Pt has no other concerns at this time.   HPI  Past Medical History:  Diagnosis Date   Medical history non-contributory    Supervision of other normal pregnancy 07/30/2013    Patient Active Problem List   Diagnosis Date Noted   Pregnancy 01/31/2014   Contraception tubal ligation at 4-6 wk pp 01/28/2014   IUGR (intrauterine growth restriction) 11/27/2013   Echogenic intracardiac focus of fetus on prenatal ultrasound 10/30/2013   Preterm contractions 10/30/2013   BV (bacterial vaginosis) 10/26/2013   Two vessel umbilical cord, antepartum 10/01/2013   Marijuana use 08/27/2013   H/O postpartum hemorrhage, currently pregnant 08/27/2013   Smoker 08/27/2013   Supervision of high risk pregnancy in third trimester 07/30/2013    Past Surgical History:  Procedure Laterality Date   LAPAROSCOPIC BILATERAL SALPINGECTOMY Bilateral 03/20/2014   Procedure: LAPAROSCOPIC BILATERAL SALPINGECTOMY;  Surgeon: Vonn VEAR Inch, MD;  Location: [facility];  Service: Gynecology;  Laterality: Bilateral;   NO PAST SURGERIES      OB History     Gravida  3   Para  3   Term  3   Preterm      AB      Living  3      SAB      IAB      Ectopic      Multiple  0   Live Births  3            Home Medications    Prior to Admission medications  Medication Sig Start  Date End Date Taking? Authorizing Provider  benzonatate  (TESSALON ) 200 MG capsule Take 1 capsule (200 mg total) by mouth 3 (three) times daily as needed. 03/06/24  Yes Doloras Tellado, Jodi R, NP  fluticasone  (FLONASE ) 50 MCG/ACT nasal spray Place 2 sprays into both nostrils daily. 02/11/22   Leath-Warren, Etta PARAS, NP  ondansetron  (ZOFRAN -ODT) 4 MG disintegrating tablet Take 1 tablet (4 mg total) by mouth every 8 (eight) hours as needed for nausea or vomiting. 08/04/22   Vonna Sharlet POUR, MD  ferrous sulfate  325 (65 FE) MG tablet Take 1 tablet (325 mg total) by mouth 2 (two) times daily with a meal. Patient not taking: Reported on 03/14/2014 02/02/14 05/15/14  Kizzie Suzen SAUNDERS, CNM    Family History Family History  Problem Relation Age of Onset   Diabetes Father    Hypertension Maternal Grandmother    Diabetes Paternal Grandfather    Seizures Other    Hypertension Other    Diabetes Other     Social History Social History[1]   Allergies   Patient has no known allergies.   Review of Systems Review of Systems  HENT:  Positive for congestion and sore throat.   Respiratory:  Positive for cough.   Musculoskeletal:  Positive for myalgias.  Physical Exam Triage Vital Signs ED Triage Vitals  Encounter Vitals Group     BP 03/06/24 1547 (!) 141/101     Girls Systolic BP Percentile --      Girls Diastolic BP Percentile --      Boys Systolic BP Percentile --      Boys Diastolic BP Percentile --      Pulse Rate 03/06/24 1547 96     Resp 03/06/24 1547 18     Temp 03/06/24 1549 98 F (36.7 C)     Temp Source 03/06/24 1547 Oral     SpO2 03/06/24 1547 99 %     Weight --      Height --      Head Circumference --      Peak Flow --      Pain Score 03/06/24 1546 2     Pain Loc --      Pain Education --      Exclude from Growth Chart --    No data found.  Updated Vital Signs BP (!) 141/101 (BP Location: Right Arm)   Pulse 96   Temp 98 F (36.7 C)   Resp 18   LMP 02/16/2024  (Exact Date)   SpO2 99%   Breastfeeding No   Visual Acuity Right Eye Distance:   Left Eye Distance:   Bilateral Distance:    Right Eye Near:   Left Eye Near:    Bilateral Near:     Physical Exam Vitals and nursing note reviewed.  Constitutional:      General: She is not in acute distress.    Appearance: She is well-developed. She is not ill-appearing.  HENT:     Head: Normocephalic and atraumatic.     Right Ear: Tympanic membrane and ear canal normal.     Left Ear: Tympanic membrane and ear canal normal.     Nose: Congestion present.     Mouth/Throat:     Mouth: Mucous membranes are moist.     Pharynx: Oropharynx is clear. Uvula midline. Posterior oropharyngeal erythema present.     Tonsils: No tonsillar exudate or tonsillar abscesses.  Eyes:     Conjunctiva/sclera: Conjunctivae normal.     Pupils: Pupils are equal, round, and reactive to light.  Cardiovascular:     Rate and Rhythm: Normal rate and regular rhythm.     Heart sounds: Normal heart sounds.  Pulmonary:     Effort: Pulmonary effort is normal.     Breath sounds: Normal breath sounds. No wheezing, rhonchi or rales.  Musculoskeletal:     Cervical back: Normal range of motion and neck supple.  Lymphadenopathy:     Cervical: No cervical adenopathy.  Skin:    General: Skin is warm and dry.  Neurological:     General: No focal deficit present.     Mental Status: She is alert and oriented to person, place, and time.  Psychiatric:        Mood and Affect: Mood normal.        Behavior: Behavior normal.      UC Treatments / Results  Labs (all labs ordered are listed, but only abnormal results are displayed) Labs Reviewed  POC COVID19/FLU A&B COMBO  POCT RAPID STREP A (OFFICE)    EKG   Radiology No results found.  Procedures Procedures (including critical care time)  Medications Ordered in UC Medications - No data to display  Initial Impression / Assessment and Plan / UC Course  I have reviewed  the triage vital signs and the nursing notes.  Pertinent labs & imaging results that were available during my care of the patient were reviewed by me and considered in my medical decision making (see chart for details).     Reviewed exam and symptoms with patient.  No red flags.  Negative rapid strep.  COVID and flu pending.  Patient unable to wait for results as she had to go pick up her child.  Advised we will contact her only if the test result is positive.  Discussed viral illness and symptomatic treatment.  Tessalon  as needed for cough.  Encourage rest fluids and PCP follow-up if symptoms do not improve.  ER precautions reviewed Final Clinical Impressions(s) / UC Diagnoses   Final diagnoses:  Sore throat  Acute cough  Viral illness     Discharge Instructions      Please treat your symptoms with over the counter tylenol  or ibuprofen , humidifier, and rest.  You may take Tessalon  3 times a day as needed for your cough.  Viral illnesses can last 7-14 days. Please follow up with your PCP if your symptoms are not improving. Please go to the ER for any worsening symptoms. This includes but is not limited to fever you can not control with tylenol  or ibuprofen , you are not able to stay hydrated, you have shortness of breath or chest pain.  Thank you for choosing [facility] for your healthcare needs. I hope you feel better soon!      ED Prescriptions     Medication Sig Dispense Auth. Provider   benzonatate  (TESSALON ) 200 MG capsule Take 1 capsule (200 mg total) by mouth 3 (three) times daily as needed. 20 capsule Janey Petron, Jodi R, NP      PDMP not reviewed this encounter.     [1]  Social History Tobacco Use   Smoking status: Every Day    Current packs/day: 0.50    Average packs/day: 0.5 packs/day for 8.0 years (4.0 ttl pk-yrs)    Types: Cigarettes   Smokeless tobacco: Never  Substance Use Topics   Alcohol use: Yes    Comment: social   Drug use: No     Loreda Myla SAUNDERS,  NP 03/06/24 1631  "

## 2024-03-06 NOTE — Discharge Instructions (Signed)
 Please treat your symptoms with over the counter tylenol or ibuprofen, humidifier, and rest.  You may take Tessalon 3 times a day as needed for your cough.  Viral illnesses can last 7-14 days. Please follow up with your PCP if your symptoms are not improving. Please go to the ER for any worsening symptoms. This includes but is not limited to fever you can not control with tylenol or ibuprofen, you are not able to stay hydrated, you have shortness of breath or chest pain.  Thank you for choosing [facility] for your healthcare needs. I hope you feel better soon!
